# Patient Record
Sex: Female | Born: 1960 | Race: White | Hispanic: No | Smoking: Former smoker
Health system: Southern US, Community
[De-identification: ages and names within clinical notes are randomized; demographics above are authoritative.]

## PROBLEM LIST (undated history)

## (undated) DIAGNOSIS — R011 Cardiac murmur, unspecified: Secondary | ICD-10-CM

## (undated) DIAGNOSIS — O9989 Other specified diseases and conditions complicating pregnancy, childbirth and the puerperium: Secondary | ICD-10-CM

## (undated) DIAGNOSIS — E559 Vitamin D deficiency, unspecified: Secondary | ICD-10-CM

## (undated) HISTORY — PX: WISDOM TOOTH EXTRACTION: SHX21

## (undated) HISTORY — DX: Other specified diseases and conditions complicating pregnancy, childbirth and the puerperium: O99.89

## (undated) HISTORY — PX: ABDOMINAL HYSTERECTOMY: SHX81

## (undated) HISTORY — DX: Cardiac murmur, unspecified: R01.1

## (undated) HISTORY — PX: COLONOSCOPY: SHX174

## (undated) HISTORY — DX: Vitamin D deficiency, unspecified: E55.9

## (undated) HISTORY — PX: DILATION AND CURETTAGE OF UTERUS: SHX78

---

## 1982-12-09 DIAGNOSIS — O99891 Other specified diseases and conditions complicating pregnancy: Secondary | ICD-10-CM

## 1982-12-09 HISTORY — DX: Other specified diseases and conditions complicating pregnancy: O99.891

## 2000-11-06 ENCOUNTER — Emergency Department (HOSPITAL_COMMUNITY): Admission: EM | Admit: 2000-11-06 | Discharge: 2000-11-06 | Payer: Self-pay | Admitting: Emergency Medicine

## 2005-01-28 ENCOUNTER — Encounter (INDEPENDENT_AMBULATORY_CARE_PROVIDER_SITE_OTHER): Payer: Self-pay | Admitting: Specialist

## 2005-01-28 ENCOUNTER — Ambulatory Visit (HOSPITAL_COMMUNITY): Admission: RE | Admit: 2005-01-28 | Discharge: 2005-01-28 | Payer: Self-pay | Admitting: *Deleted

## 2008-09-03 ENCOUNTER — Encounter: Admission: RE | Admit: 2008-09-03 | Discharge: 2008-09-03 | Payer: Self-pay | Admitting: Obstetrics and Gynecology

## 2008-09-09 ENCOUNTER — Encounter: Admission: RE | Admit: 2008-09-09 | Discharge: 2008-09-09 | Payer: Self-pay | Admitting: Obstetrics and Gynecology

## 2010-09-25 NOTE — Op Note (Signed)
Briana Meadows, Briana Meadows           ACCOUNT NO.:  [id number]   MEDICAL RECORD NO.:  [id number]          PATIENT TYPE:  AMB   LOCATION:  SDC                           FACILITY:  [facility]   PHYSICIAN:  Boyce B. Earlene Plater, M.D.  DATE OF BIRTH:  15-Apr-1961   DATE OF PROCEDURE:  01/28/2005  DATE OF DISCHARGE:                                 OPERATIVE REPORT   PREOPERATIVE DIAGNOSES:  1.  Abnormal uterine bleeding.  2.  Endometrial polyp.   POSTOPERATIVE DIAGNOSES:  1.  Abnormal uterine bleeding.  2.  Endometrial polyp.   OPERATION/PROCEDURE:  1.  Hysteroscopy.  2.  Dilatation and curettage.  3.  Polyp removal.   SURGEON:  Tyler Run B. Earlene Plater, M.D.   ASSISTANT:  None.   ANESTHESIA:  LMA general.  10 cc of 1% nesacaine paracervical block.   SPECIMENS:  Endometrial polyp and curettings.   ESTIMATED BLOOD LOSS:  Minimal.   FLUID DEFICIT:  50 mL sorbitol.   COMPLICATIONS:  None.   INDICATIONS:  Patient with history of irregular bleeding.  Ultrasound showed  a focal endometrial mass.  Sonohystogram suggestive of endometrial polyp.  The patient was advised the risks of surgery including infection, bleeding,  perforation, and damage to surrounding organs.   DESCRIPTION OF PROCEDURE:  The patient was taken to the operating room and  LMA general anesthesia was obtained.  She was placed in stirrups and prepped  and draped in the standard fashion.  Bladder emptied with catheter.  Examination under anesthesia showed a retroverted normal size uterus.  No  adnexal pathology.   Speculum inserted and paracervical block placed.  Anterior lip of the cervix  grasped with a single-tooth tenaculum.  The cervix was dilated to without  difficulty.   The diagnostic hysteroscope was inserted after being flushed with  sorbitol.  Endometrial polyp was encountered.  Otherwise the cavity was normal.  The  polyp was removed with Randall-Stone forceps and region was gently curetted.   Instruments were removed  and cervix was hemostatic.  The patient tolerated  the procedure well without complications and was taken to the recovery room  awake, alert and in stable condition.      Gerri Spore B. Earlene Plater, M.D.  Electronically Signed     WBD/MEDQ  D:  01/28/2005  T:  01/28/2005  Job:  [id number]

## 2011-11-22 ENCOUNTER — Other Ambulatory Visit: Payer: Self-pay | Admitting: Obstetrics and Gynecology

## 2011-11-22 DIAGNOSIS — Z1231 Encounter for screening mammogram for malignant neoplasm of breast: Secondary | ICD-10-CM

## 2011-12-17 ENCOUNTER — Ambulatory Visit
Admission: RE | Admit: 2011-12-17 | Discharge: 2011-12-17 | Disposition: A | Discharge status: Home / Self Care | Payer: Commercial Indemnity | Source: Ambulatory Visit | Attending: Obstetrics and Gynecology | Admitting: Obstetrics and Gynecology

## 2011-12-17 DIAGNOSIS — Z1231 Encounter for screening mammogram for malignant neoplasm of breast: Secondary | ICD-10-CM

## 2012-09-01 ENCOUNTER — Encounter: Payer: Self-pay | Admitting: Family Medicine

## 2012-09-01 ENCOUNTER — Ambulatory Visit: Payer: Managed Care, Other (non HMO)

## 2012-09-01 ENCOUNTER — Ambulatory Visit (INDEPENDENT_AMBULATORY_CARE_PROVIDER_SITE_OTHER): Payer: Managed Care, Other (non HMO) | Admitting: Family Medicine

## 2012-09-01 VITALS — BP 129/84 | HR 73 | Temp 98.3°F | Resp 16 | Ht 61.5 in | Wt 170.0 lb

## 2012-09-01 DIAGNOSIS — R05 Cough: Secondary | ICD-10-CM

## 2012-09-01 DIAGNOSIS — Z23 Encounter for immunization: Secondary | ICD-10-CM

## 2012-09-01 DIAGNOSIS — B351 Tinea unguium: Secondary | ICD-10-CM

## 2012-09-01 DIAGNOSIS — E559 Vitamin D deficiency, unspecified: Secondary | ICD-10-CM

## 2012-09-01 DIAGNOSIS — Z87891 Personal history of nicotine dependence: Secondary | ICD-10-CM

## 2012-09-01 DIAGNOSIS — Z0000 Encounter for general adult medical examination without abnormal findings: Secondary | ICD-10-CM

## 2012-09-01 LAB — CBC WITH DIFFERENTIAL/PLATELET
Basophils Absolute: 0 10*3/uL (ref 0.0–0.1)
Basophils Relative: 1 % (ref 0–1)
Eosinophils Relative: 1 % (ref 0–5)
HCT: 43.4 % (ref 36.0–46.0)
Hemoglobin: 14.8 g/dL (ref 12.0–15.0)
Lymphocytes Relative: 32 % (ref 12–46)
MCHC: 34.1 g/dL (ref 30.0–36.0)
MCV: 87.9 fL (ref 78.0–100.0)
Monocytes Absolute: 0.4 10*3/uL (ref 0.1–1.0)
Monocytes Relative: 7 % (ref 3–12)
RDW: 13.2 % (ref 11.5–15.5)

## 2012-09-01 LAB — COMPREHENSIVE METABOLIC PANEL
AST: 18 U/L (ref 0–37)
Albumin: 4.5 g/dL (ref 3.5–5.2)
BUN: 9 mg/dL (ref 6–23)
Calcium: 9.8 mg/dL (ref 8.4–10.5)
Chloride: 104 mEq/L (ref 96–112)
Creat: 0.78 mg/dL (ref 0.50–1.10)
Glucose, Bld: 100 mg/dL — ABNORMAL HIGH (ref 70–99)
Potassium: 4.6 mEq/L (ref 3.5–5.3)

## 2012-09-01 LAB — TSH: TSH: 1.383 u[IU]/mL (ref 0.350–4.500)

## 2012-09-01 LAB — LIPID PANEL
HDL: 74 mg/dL (ref >39)
Total CHOL/HDL Ratio: 2.9 Ratio
Triglycerides: 81 mg/dL (ref <150)

## 2012-09-01 NOTE — Progress Notes (Signed)
Subjective:    Patient ID: Briana Meadows, female    DOB: 08-10-1960, 52 y.o.   MRN: [id number] Chief Complaint  Patient presents with  . Annual Exam    HPI  Doing great. No complaints.  Stopped smoking last month. Has switched to vapor e-cigarettes. Plans to use those for about 6 months then wean off of them.  Taking 5000u vit D 5d/wk.  Gets mammograms and pap smears regularly through Dr. Billy Coast at [facility]. All paps normal so does not need repeat x 5 yrs.  No prior h/o colonoscopy. Does not remember last TDaP.  Nails thick and yellow.  Review of Systems  Constitutional: Negative.   HENT: Negative.   Eyes: Negative.   Respiratory: Negative.   Cardiovascular: Negative.   Gastrointestinal: Negative.   Endocrine: Negative.   Genitourinary: Negative.   Musculoskeletal: Negative.   Skin: Negative.   Allergic/Immunologic: Negative.   Neurological: Negative.   Hematological: Negative.   Psychiatric/Behavioral: Negative.   All other systems reviewed and are negative.      BP 129/84  Pulse 73  Temp(Src) 98.3 F (36.8 C)  Resp 16  Ht 5' 1.5" (1.562 m)  Wt 170 lb (77.111 kg)  BMI 31.6 kg/m2  LMP 01/02/2012 Objective:   Physical Exam  Constitutional: She is oriented to person, place, and time. She appears well-developed and well-nourished. No distress.  HENT:  Head: Normocephalic and atraumatic.  Right Ear: Tympanic membrane, external ear and ear canal normal.  Left Ear: Tympanic membrane, external ear and ear canal normal.  Nose: Nose normal. No mucosal edema or rhinorrhea.  Mouth/Throat: Uvula is midline, oropharynx is clear and moist and mucous membranes are normal. No posterior oropharyngeal erythema.  Eyes: Conjunctivae and EOM are normal. Pupils are equal, round, and reactive to light. Right eye exhibits no discharge. Left eye exhibits no discharge. No scleral icterus.  Neck: Normal range of motion. Neck supple. No thyromegaly present.  Cardiovascular: Normal  rate, regular rhythm, normal heart sounds and intact distal pulses.   Pulmonary/Chest: Effort normal and breath sounds normal. No respiratory distress.  Exp wheeze with coughing. None with deep breathing.  Abdominal: Soft. Bowel sounds are normal. There is no tenderness.  Genitourinary: No breast swelling, tenderness, discharge or bleeding.  Musculoskeletal: She exhibits no edema.  Lymphadenopathy:    She has no cervical adenopathy.  Neurological: She is alert and oriented to person, place, and time. She has normal reflexes.  Skin: Skin is warm and dry. She is not diaphoretic. No erythema.  Bilateral toenails thick and yellowed - worst in first toes.  Psychiatric: She has a normal mood and affect. Her behavior is normal.      [facility] reading (PRIMARY) by  Dr. Clelia Croft. CXR: no abnormality seen. No nodules. Assessment & Plan:  Routine general medical examination at a health care facility - Plan: Tdap vaccine greater than or equal to 7yo IM, CBC with Differential, Comprehensive metabolic panel, Lipid panel, TSH, Vitamin D 25 hydroxy. Will turn 52 yo in 5 wks. Refer for colonoscopy.  Unspecified vitamin D deficiency - last vit D 20,  On adequate OTC replacement.  Onychomycosis - try vics vaporub - if doesn't work after sev mos and lfts nml, consider lamisil.  History of smoking - Plan: DG Chest 2 View - CONGRATS!!!  Cough - Plan: DG Chest 2 View, Comprehensive metabolic panel  Need for Tdap vaccination - Plan: Tdap vaccine greater than or equal to 7yo IM  Meds ordered this encounter  Medications  .  DISCONTD: Calcium Carbonate-Vitamin D (CALCIUM-VITAMIN D) 500-200 MG-UNIT per tablet    Sig: Take 1 tablet by mouth 2 (two) times daily with a meal.  . CALCIUM PO    Sig: Take 1,000 mg by mouth.  . cholecalciferol (VITAMIN D) 1000 UNITS tablet    Sig: Take by mouth daily.

## 2012-09-01 NOTE — Patient Instructions (Addendum)

## 2013-03-15 ENCOUNTER — Other Ambulatory Visit: Payer: Self-pay

## 2014-02-22 ENCOUNTER — Other Ambulatory Visit: Payer: Self-pay

## 2014-03-04 ENCOUNTER — Other Ambulatory Visit: Payer: Self-pay | Admitting: Obstetrics and Gynecology

## 2014-03-06 ENCOUNTER — Encounter (HOSPITAL_COMMUNITY): Payer: Self-pay | Admitting: Pharmacist

## 2014-03-07 NOTE — Patient Instructions (Addendum)
   Your procedure is scheduled on:  Thursday, Nov 5  Enter through the [employer] [facility] at:  [place] up the phone at the desk and dial [phone] and inform us of your arrival.  Please call this number if you have any problems the morning of surgery: [phone]  Remember: Do not eat food after midnight: Wednesday Do not drink clear liquids after: 6 Am Thursday, day of surgery Take these medicines the morning of surgery with a SIP OF WATER:  None  Do not wear jewelry, make-up, or FINGER nail polish No metal in your hair or on your body. Do not wear lotions, powders, perfumes.  You may wear deodorant.  Do not bring valuables to the hospital. Contacts, dentures or bridgework may not be worn into surgery.  Leave suitcase in the car. After Surgery it may be brought to your room. For patients being admitted to the hospital, checkout time is 11:00am the day of discharge.  Home with Mother [name].

## 2014-03-08 ENCOUNTER — Encounter (HOSPITAL_COMMUNITY)
Admission: RE | Admit: 2014-03-08 | Discharge: 2014-03-08 | Disposition: A | Discharge status: Home / Self Care | Payer: Managed Care, Other (non HMO) | Source: Ambulatory Visit | Attending: Obstetrics and Gynecology | Admitting: Obstetrics and Gynecology

## 2014-03-08 ENCOUNTER — Encounter (HOSPITAL_COMMUNITY): Payer: Self-pay

## 2014-03-08 ENCOUNTER — Ambulatory Visit (INDEPENDENT_AMBULATORY_CARE_PROVIDER_SITE_OTHER): Payer: Managed Care, Other (non HMO)

## 2014-03-08 DIAGNOSIS — Z23 Encounter for immunization: Secondary | ICD-10-CM

## 2014-03-08 DIAGNOSIS — Z01812 Encounter for preprocedural laboratory examination: Secondary | ICD-10-CM | POA: Diagnosis not present

## 2014-03-08 LAB — BASIC METABOLIC PANEL
ANION GAP: 11 (ref 5–15)
BUN: 11 mg/dL (ref 6–23)
CALCIUM: 9.3 mg/dL (ref 8.4–10.5)
CO2: 23 mEq/L (ref 19–32)
Chloride: 104 mEq/L (ref 96–112)
Creatinine, Ser: 0.57 mg/dL (ref 0.50–1.10)
Glucose, Bld: 103 mg/dL — ABNORMAL HIGH (ref 70–99)
Potassium: 4.5 mEq/L (ref 3.7–5.3)
SODIUM: 138 meq/L (ref 137–147)

## 2014-03-08 LAB — CBC
HCT: 41.2 % (ref 36.0–46.0)
Hemoglobin: 14.3 g/dL (ref 12.0–15.0)
MCH: 30.4 pg (ref 26.0–34.0)
MCHC: 34.7 g/dL (ref 30.0–36.0)
MCV: 87.7 fL (ref 78.0–100.0)
PLATELETS: 318 10*3/uL (ref 150–400)
RBC: 4.7 MIL/uL (ref 3.87–5.11)
RDW: 14.1 % (ref 11.5–15.5)
WBC: 6 10*3/uL (ref 4.0–10.5)

## 2014-03-14 ENCOUNTER — Ambulatory Visit (HOSPITAL_COMMUNITY): Payer: Managed Care, Other (non HMO) | Admitting: Anesthesiology

## 2014-03-14 ENCOUNTER — Encounter (HOSPITAL_COMMUNITY): Payer: Self-pay | Admitting: Emergency Medicine

## 2014-03-14 ENCOUNTER — Ambulatory Visit (HOSPITAL_COMMUNITY)
Admission: RE | Admit: 2014-03-14 | Discharge: 2014-03-15 | Disposition: A | Discharge status: Home / Self Care | Payer: Managed Care, Other (non HMO) | Source: Ambulatory Visit | Attending: Obstetrics and Gynecology | Admitting: Obstetrics and Gynecology

## 2014-03-14 ENCOUNTER — Encounter (HOSPITAL_COMMUNITY)
Admission: RE | Disposition: A | Discharge status: Home / Self Care | Payer: Self-pay | Source: Ambulatory Visit | Attending: Obstetrics and Gynecology

## 2014-03-14 DIAGNOSIS — N736 Female pelvic peritoneal adhesions (postinfective): Secondary | ICD-10-CM | POA: Insufficient documentation

## 2014-03-14 DIAGNOSIS — D259 Leiomyoma of uterus, unspecified: Secondary | ICD-10-CM | POA: Insufficient documentation

## 2014-03-14 DIAGNOSIS — N815 Vaginal enterocele: Secondary | ICD-10-CM | POA: Diagnosis not present

## 2014-03-14 DIAGNOSIS — N950 Postmenopausal bleeding: Secondary | ICD-10-CM | POA: Insufficient documentation

## 2014-03-14 DIAGNOSIS — D219 Benign neoplasm of connective and other soft tissue, unspecified: Secondary | ICD-10-CM | POA: Diagnosis present

## 2014-03-14 DIAGNOSIS — Z87891 Personal history of nicotine dependence: Secondary | ICD-10-CM | POA: Insufficient documentation

## 2014-03-14 DIAGNOSIS — N840 Polyp of corpus uteri: Secondary | ICD-10-CM | POA: Insufficient documentation

## 2014-03-14 DIAGNOSIS — N8320 Unspecified ovarian cysts: Secondary | ICD-10-CM | POA: Insufficient documentation

## 2014-03-14 HISTORY — PX: ROBOTIC ASSISTED TOTAL HYSTERECTOMY: SHX6085

## 2014-03-14 SURGERY — ROBOTIC ASSISTED TOTAL HYSTERECTOMY
Anesthesia: General | Site: Abdomen

## 2014-03-14 MED ORDER — DEXAMETHASONE SODIUM PHOSPHATE 4 MG/ML IJ SOLN
INTRAMUSCULAR | Status: AC
  Filled 2014-03-14: qty 1

## 2014-03-14 MED ORDER — ARTIFICIAL TEARS OP OINT
TOPICAL_OINTMENT | OPHTHALMIC | Status: AC
  Filled 2014-03-14: qty 3.5

## 2014-03-14 MED ORDER — SODIUM CHLORIDE 0.9 % IJ SOLN
INTRAMUSCULAR | Status: AC
  Filled 2014-03-14: qty 10

## 2014-03-14 MED ORDER — ROPIVACAINE HCL 5 MG/ML IJ SOLN
INTRAMUSCULAR | Status: AC
  Filled 2014-03-14: qty 60

## 2014-03-14 MED ORDER — DEXTROSE IN LACTATED RINGERS 5 % IV SOLN
INTRAVENOUS | Status: DC
  Administered 2014-03-14: via INTRAVENOUS

## 2014-03-14 MED ORDER — DIPHENHYDRAMINE HCL 12.5 MG/5ML PO ELIX: 12.5000 mg | ORAL_SOLUTION | Freq: Four times a day (QID) | ORAL | Status: DC | PRN

## 2014-03-14 MED ORDER — DIPHENHYDRAMINE HCL 50 MG/ML IJ SOLN: 12.5000 mg | Freq: Four times a day (QID) | INTRAMUSCULAR | Status: DC | PRN

## 2014-03-14 MED ORDER — GLYCOPYRROLATE 0.2 MG/ML IJ SOLN
INTRAMUSCULAR | Status: DC | PRN
  Administered 2014-03-14: .4 mg via INTRAVENOUS

## 2014-03-14 MED ORDER — NEOSTIGMINE METHYLSULFATE 10 MG/10ML IV SOLN
INTRAVENOUS | Status: DC | PRN
  Administered 2014-03-14: 3 mg via INTRAVENOUS

## 2014-03-14 MED ORDER — CEFAZOLIN SODIUM-DEXTROSE 2-3 GM-% IV SOLR
INTRAVENOUS | Status: AC
  Filled 2014-03-14: qty 50

## 2014-03-14 MED ORDER — PROPOFOL 10 MG/ML IV BOLUS
INTRAVENOUS | Status: DC | PRN
  Administered 2014-03-14: 200 mg via INTRAVENOUS

## 2014-03-14 MED ORDER — PROPOFOL 10 MG/ML IV EMUL
INTRAVENOUS | Status: AC
  Filled 2014-03-14: qty 20

## 2014-03-14 MED ORDER — SODIUM CHLORIDE 0.9 % IV SOLN
INTRAVENOUS | Status: DC | PRN
  Administered 2014-03-14: 86 mL

## 2014-03-14 MED ORDER — SODIUM CHLORIDE 0.9 % IJ SOLN: 9.0000 mL | INTRAMUSCULAR | Status: DC | PRN

## 2014-03-14 MED ORDER — ACETAMINOPHEN 10 MG/ML IV SOLN
1000.0000 mg | Freq: Once | INTRAVENOUS | Status: AC
  Administered 2014-03-14: 1000 mg via INTRAVENOUS
  Filled 2014-03-14: qty 100

## 2014-03-14 MED ORDER — PROMETHAZINE HCL 25 MG/ML IJ SOLN
6.2500 mg | INTRAMUSCULAR | Status: DC | PRN
Start: 2014-03-14 — End: 2014-03-14

## 2014-03-14 MED ORDER — KETOROLAC TROMETHAMINE 30 MG/ML IJ SOLN
INTRAMUSCULAR | Status: AC
  Filled 2014-03-14: qty 1

## 2014-03-14 MED ORDER — TRAMADOL HCL 50 MG PO TABS
50.0000 mg | ORAL_TABLET | Freq: Four times a day (QID) | ORAL | Status: DC | PRN
  Administered 2014-03-15: 50 mg via ORAL
  Filled 2014-03-14 (×2): qty 1

## 2014-03-14 MED ORDER — GLYCOPYRROLATE 0.2 MG/ML IJ SOLN
INTRAMUSCULAR | Status: AC
Start: 2014-03-14 — End: 2014-03-14
  Filled 2014-03-14: qty 2

## 2014-03-14 MED ORDER — MIDAZOLAM HCL 2 MG/2ML IJ SOLN
INTRAMUSCULAR | Status: AC
  Filled 2014-03-14: qty 2

## 2014-03-14 MED ORDER — FENTANYL CITRATE 0.05 MG/ML IJ SOLN
25.0000 ug | INTRAMUSCULAR | Status: DC | PRN
  Administered 2014-03-14: 50 ug via INTRAVENOUS

## 2014-03-14 MED ORDER — FENTANYL CITRATE 0.05 MG/ML IJ SOLN
INTRAMUSCULAR | Status: AC
  Filled 2014-03-14: qty 5

## 2014-03-14 MED ORDER — ONDANSETRON HCL 4 MG/2ML IJ SOLN
INTRAMUSCULAR | Status: DC | PRN
  Administered 2014-03-14: 4 mg via INTRAVENOUS

## 2014-03-14 MED ORDER — LIDOCAINE HCL (CARDIAC) 20 MG/ML IV SOLN
INTRAVENOUS | Status: AC
  Filled 2014-03-14: qty 5

## 2014-03-14 MED ORDER — FENTANYL CITRATE 0.05 MG/ML IJ SOLN
INTRAMUSCULAR | Status: AC
  Administered 2014-03-14: 50 ug via INTRAVENOUS
  Filled 2014-03-14: qty 2

## 2014-03-14 MED ORDER — ONDANSETRON HCL 4 MG/2ML IJ SOLN
INTRAMUSCULAR | Status: AC
  Filled 2014-03-14: qty 2

## 2014-03-14 MED ORDER — MIDAZOLAM HCL 2 MG/2ML IJ SOLN
INTRAMUSCULAR | Status: DC | PRN
  Administered 2014-03-14: 2 mg via INTRAVENOUS

## 2014-03-14 MED ORDER — DEXAMETHASONE SODIUM PHOSPHATE 4 MG/ML IJ SOLN
INTRAMUSCULAR | Status: DC | PRN
  Administered 2014-03-14: 4 mg via INTRAVENOUS

## 2014-03-14 MED ORDER — SODIUM CHLORIDE 0.9 % IJ SOLN
INTRAMUSCULAR | Status: AC
Start: 2014-03-14 — End: 2014-03-14
  Filled 2014-03-14: qty 50

## 2014-03-14 MED ORDER — KETOROLAC TROMETHAMINE 30 MG/ML IJ SOLN
INTRAMUSCULAR | Status: DC | PRN
  Administered 2014-03-14: 30 mg via INTRAVENOUS

## 2014-03-14 MED ORDER — HYDROMORPHONE HCL 1 MG/ML IJ SOLN
INTRAMUSCULAR | Status: DC | PRN
  Administered 2014-03-14: 0.5 mg via INTRAVENOUS
  Administered 2014-03-14: .5 mg via INTRAVENOUS

## 2014-03-14 MED ORDER — NALOXONE HCL 0.4 MG/ML IJ SOLN: 0.4000 mg | INTRAMUSCULAR | Status: DC | PRN

## 2014-03-14 MED ORDER — CEFAZOLIN SODIUM-DEXTROSE 2-3 GM-% IV SOLR
2.0000 g | INTRAVENOUS | Status: AC
  Administered 2014-03-14: 2 g via INTRAVENOUS

## 2014-03-14 MED ORDER — LIDOCAINE HCL (CARDIAC) 20 MG/ML IV SOLN
INTRAVENOUS | Status: DC | PRN
  Administered 2014-03-14: 60 mg via INTRAVENOUS

## 2014-03-14 MED ORDER — MEPERIDINE HCL 25 MG/ML IJ SOLN: 6.2500 mg | INTRAMUSCULAR | Status: DC | PRN

## 2014-03-14 MED ORDER — LACTATED RINGERS IV SOLN
INTRAVENOUS | Status: DC
  Administered 2014-03-14 (×2): via INTRAVENOUS

## 2014-03-14 MED ORDER — OXYCODONE-ACETAMINOPHEN 5-325 MG PO TABS
1.0000 | ORAL_TABLET | ORAL | Status: DC | PRN
  Administered 2014-03-15: 1 via ORAL
  Filled 2014-03-14: qty 1

## 2014-03-14 MED ORDER — SCOPOLAMINE 1 MG/3DAYS TD PT72
MEDICATED_PATCH | TRANSDERMAL | Status: AC
  Administered 2014-03-14: 1.5 mg via TRANSDERMAL
  Filled 2014-03-14: qty 1

## 2014-03-14 MED ORDER — HYDROMORPHONE HCL 1 MG/ML IJ SOLN
INTRAMUSCULAR | Status: AC
  Filled 2014-03-14: qty 1

## 2014-03-14 MED ORDER — HYDROMORPHONE 0.3 MG/ML IV SOLN
INTRAVENOUS | Status: DC
  Administered 2014-03-14: 0.4 mg via INTRAVENOUS
  Administered 2014-03-14: 17:00:00 via INTRAVENOUS
  Administered 2014-03-15: 0.199 mg via INTRAVENOUS
  Filled 2014-03-14: qty 25

## 2014-03-14 MED ORDER — ROCURONIUM BROMIDE 100 MG/10ML IV SOLN
INTRAVENOUS | Status: AC
  Filled 2014-03-14: qty 1

## 2014-03-14 MED ORDER — ROCURONIUM BROMIDE 100 MG/10ML IV SOLN
INTRAVENOUS | Status: DC | PRN
  Administered 2014-03-14: 5 mg via INTRAVENOUS
  Administered 2014-03-14: 50 mg via INTRAVENOUS

## 2014-03-14 MED ORDER — ONDANSETRON HCL 4 MG/2ML IJ SOLN
4.0000 mg | Freq: Four times a day (QID) | INTRAMUSCULAR | Status: DC | PRN
Start: 2014-03-14 — End: 2014-03-15

## 2014-03-14 MED ORDER — KETOROLAC TROMETHAMINE 30 MG/ML IJ SOLN: 15.0000 mg | Freq: Once | INTRAMUSCULAR | Status: DC | PRN

## 2014-03-14 MED ORDER — NEOSTIGMINE METHYLSULFATE 10 MG/10ML IV SOLN
INTRAVENOUS | Status: AC
Start: 2014-03-14 — End: 2014-03-14
  Filled 2014-03-14: qty 1

## 2014-03-14 MED ORDER — FENTANYL CITRATE 0.05 MG/ML IJ SOLN
INTRAMUSCULAR | Status: DC | PRN
  Administered 2014-03-14 (×5): 50 ug via INTRAVENOUS

## 2014-03-14 MED ORDER — SCOPOLAMINE 1 MG/3DAYS TD PT72
1.0000 | MEDICATED_PATCH | Freq: Once | TRANSDERMAL | Status: DC
  Administered 2014-03-14: 1.5 mg via TRANSDERMAL

## 2014-03-14 SURGICAL SUPPLY — 58 items
BARRIER ADHS 3X4 INTERCEED (GAUZE/BANDAGES/DRESSINGS) ×3 IMPLANT
BRR ADH 4X3 ABS CNTRL BYND (GAUZE/BANDAGES/DRESSINGS) ×1
CATH FOLEY 3WAY  5CC 16FR (CATHETERS) ×2
CATH FOLEY 3WAY 5CC 16FR (CATHETERS) ×1 IMPLANT
CLOTH BEACON ORANGE TIMEOUT ST (SAFETY) ×3 IMPLANT
CONT PATH 16OZ SNAP LID 3702 (MISCELLANEOUS) ×3 IMPLANT
COVER BACK TABLE 60X90IN (DRAPES) ×6 IMPLANT
COVER TIP SHEARS 8 DVNC (MISCELLANEOUS) ×1 IMPLANT
COVER TIP SHEARS 8MM DA VINCI (MISCELLANEOUS) ×2
DECANTER SPIKE VIAL GLASS SM (MISCELLANEOUS) ×12 IMPLANT
DRAPE HUG U DISPOSABLE (DRAPE) ×3 IMPLANT
DRAPE WARM FLUID 44X44 (DRAPE) ×3 IMPLANT
DRSG COVADERM PLUS 2X2 (GAUZE/BANDAGES/DRESSINGS) ×2 IMPLANT
DRSG OPSITE POSTOP 3X4 (GAUZE/BANDAGES/DRESSINGS) ×2 IMPLANT
DURAPREP 26ML APPLICATOR (WOUND CARE) ×3 IMPLANT
ELECT REM PT RETURN 9FT ADLT (ELECTROSURGICAL) ×3
ELECTRODE REM PT RTRN 9FT ADLT (ELECTROSURGICAL) ×1 IMPLANT
EVACUATOR SMOKE 8.L (FILTER) ×3 IMPLANT
GAUZE VASELINE 3X9 (GAUZE/BANDAGES/DRESSINGS) IMPLANT
GLOVE BIO SURGEON STRL SZ7.5 (GLOVE) ×6 IMPLANT
GLOVE BIOGEL PI IND STRL 7.0 (GLOVE) ×2 IMPLANT
GLOVE BIOGEL PI INDICATOR 7.0 (GLOVE) ×4
GOWN STRL REUS W/TWL LRG LVL3 (GOWN DISPOSABLE) ×30 IMPLANT
KIT ACCESSORY DA VINCI DISP (KITS) ×2
KIT ACCESSORY DVNC DISP (KITS) ×1 IMPLANT
LEGGING LITHOTOMY PAIR STRL (DRAPES) ×3 IMPLANT
LIQUID BAND (GAUZE/BANDAGES/DRESSINGS) ×3 IMPLANT
NEEDLE INSUFFLATION 150MM (ENDOMECHANICALS) ×3 IMPLANT
OCCLUDER COLPOPNEUMO (BALLOONS) IMPLANT
PACK ROBOT WH (CUSTOM PROCEDURE TRAY) ×3 IMPLANT
PAD PREP 24X48 CUFFED NSTRL (MISCELLANEOUS) ×6 IMPLANT
PAD TRENDELENBURG POSITION (MISCELLANEOUS) ×3 IMPLANT
PROTECTOR NERVE ULNAR (MISCELLANEOUS) ×6 IMPLANT
SCISSORS LAP 5X35 DISP (ENDOMECHANICALS) ×2 IMPLANT
SET CYSTO W/LG BORE CLAMP LF (SET/KITS/TRAYS/PACK) ×2 IMPLANT
SET IRRIG TUBING LAPAROSCOPIC (IRRIGATION / IRRIGATOR) ×3 IMPLANT
SUT VIC AB 0 CT1 27 (SUTURE) ×6
SUT VIC AB 0 CT1 27XBRD ANBCTR (SUTURE) ×2 IMPLANT
SUT VICRYL 0 UR6 27IN ABS (SUTURE) ×3 IMPLANT
SUT VICRYL RAPIDE 4/0 PS 2 (SUTURE) ×6 IMPLANT
SUT VLOC 180 0 9IN  GS21 (SUTURE) ×2
SUT VLOC 180 0 9IN GS21 (SUTURE) IMPLANT
SUT VLOC 180 2-0 9IN GS21 (SUTURE) ×4 IMPLANT
SYR 50ML LL SCALE MARK (SYRINGE) ×3 IMPLANT
SYRINGE 10CC LL (SYRINGE) ×3 IMPLANT
TIP RUMI ORANGE 6.7MMX12CM (TIP) IMPLANT
TIP UTERINE 5.1X6CM LAV DISP (MISCELLANEOUS) IMPLANT
TIP UTERINE 6.7X10CM GRN DISP (MISCELLANEOUS) IMPLANT
TIP UTERINE 6.7X6CM WHT DISP (MISCELLANEOUS) IMPLANT
TIP UTERINE 6.7X8CM BLUE DISP (MISCELLANEOUS) IMPLANT
TOWEL OR 17X24 6PK STRL BLUE (TOWEL DISPOSABLE) ×9 IMPLANT
TROCAR DISP BLADELESS 8 DVNC (TROCAR) ×1 IMPLANT
TROCAR DISP BLADELESS 8MM (TROCAR) ×2
TROCAR XCEL 12X100 BLDLESS (ENDOMECHANICALS) IMPLANT
TROCAR XCEL NON-BLD 5MMX100MML (ENDOMECHANICALS) ×3 IMPLANT
TROCAR Z-THREAD 12X150 (TROCAR) ×3 IMPLANT
WARMER LAPAROSCOPE (MISCELLANEOUS) ×3 IMPLANT
WATER STERILE IRR 1000ML POUR (IV SOLUTION) ×9 IMPLANT

## 2014-03-14 NOTE — Progress Notes (Signed)
Patient ID: Briana Meadows, female   DOB: February 07, 1961, 53 y.o.   MRN: [id number] Patient seen and examined. Consent witnessed and signed. No changes noted. Update completed.

## 2014-03-14 NOTE — H&P (Signed)
NAMESECILIA, APPS           ACCOUNT NO.:  [id number]  MEDICAL RECORD NO.:  [id number]  LOCATION:  PERIO                         FACILITY:  [facility]  PHYSICIAN:  Lovenia Kim, M.D.DATE OF BIRTH:  1961-01-17  DATE OF ADMISSION:  01/28/2014 DATE OF DISCHARGE:                             HISTORY & PHYSICAL   CHIEF COMPLAINT:  Postmenopausal bleeding and endometrial mass.  HISTORY OF PRESENT ILLNESS:  A 53 year old white female, G4, P2, postmenopausal bleeding. Normal Endometrial biopsy with symptomatic uterine fibroid for definitive surgery.  SURGICAL HISTORY:  Abortion x2.  Delivery x2.  Denies domestic violence, history of hypertension, diabetes.  She is a reformed smoker.  PHYSICAL EXAMINATION:  GENERAL:  Well-developed, well-nourished white female, in no acute distress. HEENT:  Normal. NECK:  Supple.  Full range of motion. LUNGS:  Clear. HEART:  Regular rate and rhythm. ABDOMEN:  Soft, nontender. PELVIC:  Appears to be retroflexed.  Slightly bulky.  No adnexal masses appreciated.  IMPRESSION:  Postmenopausal bleeding with symptomatic fibroids, history of nl ebx at this time proceed with total LS hysterectomy and bilateral salpingectomy .  Risks of anesthesia, infection, bleeding, with delayed versus immediate complications to include bowel and bladder injury noted.  The patient acknowledges and wishes to proceed.     Lovenia Kim, M.D.     RJT/MEDQ  D:  03/13/2014  T:  03/13/2014  Job:  [id number]

## 2014-03-14 NOTE — Op Note (Signed)
03/14/2014  3:29 PM  PATIENT:  Briana Meadows  53 y.o. female  PRE-OPERATIVE DIAGNOSIS:  Postmenopausal Bleeding, Endometrial Mass  POST-OPERATIVE DIAGNOSIS:  postmenopausal bleeding,endometrial mass Uterine fibroids Enterocele, Apical vaginal support defect Right ovarian cyst Right adnexal adhesions  PROCEDURE:  Procedure(s): ROBOTIC ASSISTED TOTAL HYSTERECTOMY WITH BILATERAL SALPINGECTOMY,  RIGHT OVARIAN CYSTECTOMY RIGHT OVARIAN CYSTOTOMY LYSIS OF ADHESIONS UTEROSACRAL LIGAMENT SUSPENSION  SURGEON:  Surgeon(s): Lovenia Kim, MD  ASSISTANTS: Renato Battles, CNM   ANESTHESIA:   local and general  ESTIMATED BLOOD LOSS: minimal  DRAINS: Urinary Catheter (Foley)   LOCAL MEDICATIONS USED:  MARCAINE    and Amount: 20 ml  SPECIMEN:  Source of Specimen:  uterus , cervix , bilateral tubes, right ovarian cyst  DISPOSITION OF SPECIMEN:  PATHOLOGY  COUNTS:  YES  DICTATION #: [id number]  PLAN OF CARE: dc home  PATIENT DISPOSITION:  PACU - hemodynamically stable.

## 2014-03-14 NOTE — Anesthesia Postprocedure Evaluation (Signed)
  Anesthesia Post-op Note  Patient: Briana Meadows  Procedure(s) Performed: Procedure(s): ROBOTIC ASSISTED TOTAL HYSTERECTOMY WITH BILATERAL SALPINGECTOMY, removal of right ovarian cyst, uterosacral ligment suspension (N/A) Patient is awake and responsive. Pain and nausea are reasonably well controlled. Vital signs are stable and clinically acceptable. Oxygen saturation is clinically acceptable. There are no apparent anesthetic complications at this time. Patient is ready for discharge.

## 2014-03-14 NOTE — Plan of Care (Signed)
Problem: Phase I Progression Outcomes Goal: IS, TCDB as ordered Outcome: Completed/Met Date Met:  03/14/14

## 2014-03-14 NOTE — Transfer of Care (Signed)
Immediate Anesthesia Transfer of Care Note  Patient: Zilda No  Procedure(s) Performed: Procedure(s): ROBOTIC ASSISTED TOTAL HYSTERECTOMY WITH BILATERAL SALPINGECTOMY, removal of right ovarian cyst, uterosacral ligment suspension (N/A)  Patient Location: PACU  Anesthesia Type:General  Level of Consciousness: awake, alert  and oriented  Airway & Oxygen Therapy: Patient Spontanous Breathing and Patient connected to nasal cannula oxygen  Post-op Assessment: Report given to PACU RN and Post -op Vital signs reviewed and stable  Post vital signs: Reviewed and stable  Complications: No apparent anesthesia complications

## 2014-03-14 NOTE — Anesthesia Preprocedure Evaluation (Addendum)
Anesthesia Evaluation  Patient identified by MRN, date of birth, ID band Patient awake    Reviewed: Allergy & Precautions, H&P , NPO status , Patient's Chart, lab work & pertinent test results, reviewed documented beta blocker date and time   History of Anesthesia Complications Negative for: history of anesthetic complications  Airway Mallampati: II  TM Distance: >3 FB Neck ROM: full    Dental  (+) Poor Dentition   Pulmonary former smoker,  breath sounds clear to auscultation  Pulmonary exam normal       Cardiovascular + Valvular Problems/Murmurs (childhood - no problems) Rhythm:regular Rate:Normal     Neuro/Psych negative neurological ROS  negative psych ROS   GI/Hepatic negative GI ROS, Neg liver ROS,   Endo/Other  negative endocrine ROS  Renal/GU negative Renal ROS     Musculoskeletal   Abdominal   Peds  Hematology negative hematology ROS (+) H/o blood transfusion with pregnancy in 1984   Anesthesia Other Findings   Reproductive/Obstetrics negative OB ROS                            Anesthesia Physical Anesthesia Plan  ASA: II  Anesthesia Plan: General ETT   Post-op Pain Management:    Induction:   Airway Management Planned:   Additional Equipment:   Intra-op Plan:   Post-operative Plan:   Informed Consent: I have reviewed the patients History and Physical, chart, labs and discussed the procedure including the risks, benefits and alternatives for the proposed anesthesia with the patient or authorized representative who has indicated his/her understanding and acceptance.   Dental Advisory Given  Plan Discussed with: CRNA and Surgeon  Anesthesia Plan Comments:         Anesthesia Quick Evaluation

## 2014-03-14 NOTE — Op Note (Signed)
Briana Meadows, Briana Meadows           ACCOUNT NO.:  [id number]  MEDICAL RECORD NO.:  [id number]  LOCATION:  5537                          FACILITY:  [facility]  PHYSICIAN:  Lovenia Kim, M.D.DATE OF BIRTH:  10-06-1960  DATE OF PROCEDURE:  03/14/2014 DATE OF DISCHARGE:                              OPERATIVE REPORT   PREOPERATIVE DIAGNOSES:  Postmenopausal bleeding, endometrial mass, normal endometrial biopsy, uterine fibroids.  POSTOPERATIVE DIAGNOSES:  Postmenopausal bleeding, endometrial mass, normal endometrial biopsy, uterine fibroids, right ovarian cyst, apical vaginal support defect, enterocele, pelvic adhesions.  PROCEDURE:  Robotically-assisted total laparoscopic hysterectomy, bilateral salpingectomy, right ovarian cystectomy, right ovarian cystotomy, lysis of right adnexal adhesions and uterosacral ligament suspension.  SURGEON:  Lovenia Kim, M.D.  ASSISTANT:  Laqueta Carina.  ESTIMATED BLOOD LOSS:  Less than 50 mL.  COMPLICATIONS:  None.  DRAINS:  Foley.  COUNTS:  Correct.  DISPOSITION:  The patient to recovery in good condition.  BRIEF OPERATIVE NOTE:  After being apprised of risks of anesthesia, infection, bleeding, injury to surrounding organs, possible need for repair, delayed versus immediate complications to include bowel and bladder injury, possible need for repair, the patient was brought to the operating room where she was administered her general anesthetic without complications.  Prepped and draped in usual sterile fashion.  A Foley catheter placed.  Exam under anesthesia reveals a bulky retroflexed uterus and no adnexal masses.  Foley catheter placed without difficulty. RUMI retractor placed without difficulty.  At this time, infraumbilical incision was made with a scalpel.  Veress needle placed, opening pressure -2.  A 3 L CO2 insufflated without difficulty.  Trocar was placed atraumatically.  Pictures taken.  Normal appendix, normal  liver, gallbladder area.  There are adhesions of the right adnexa, in addition to a right ovarian cyst and the adhesions are to the right ovarian fossa.  At this time, 2 robotic ports were placed 1 on the left, 1 on the right and 5 mm port on the left.  The EnSeal device was established. After achieving deep Trendelenburg position, the robot was docked in the standard fashion.  Atraumatic trocar entry having been assured.  At this time, the EndoShears and PK forceps are attached as a robotic instruments and attention was turned to the left adnexa whereby the ureters identified.  The mesosalpinx was cauterized open and the left tube was detached.  The retroperitoneal space was entered lateral to the left ovary and the ureters identified coursing along the medial leaf of the peritoneum.  Retroperitoneal dissection was done exposing the window between the ovary and the uterus.  The right ovarian ligament was clamped and cauterized with bipolar cautery and a 2-point method and divided.  The left round ligament was cauterized and divided.  The bladder flap was developed sharply with retroversion of the uterus.  At this time, attention was turned to the right adnexa whereby right adnexal adhesions were lysed freeing the right ovary and exophytic cyst appears off the right ovary with a cystic and solid component.  This was excised at the base and sent to Pathology for permanent section.  A right ovarian cystotomy for simple clear cystic fluid was performed as well.  The right mesosalpinx was  cauterized and divided.  The retroperitoneal space was entered on the right.  The ureter was noted along the medial leaf of the peritoneum.  Window was made exposing the skeletonization of the right ovarian ligament which was cauterized using bipolar cautery and cut.  The round ligament was divided using monopolar cautery and the bladder flap dissection was finished, dissecting the bladder sharply off the  RUMI cup.  The uterine vessels were skeletonized bilaterally, cauterized, and cut.  The specimen was then freed by using monopolar cut current to divide up the cervicovaginal junction.  A RUMI cup was exposed.  The specimen was retracted and removed vaginally.  At this time, vaginal cuff was then closed using 0 Vicryl in a continuous running fashion.  Due to exam under anesthesia noting a probable apical support defect, the uterosacral ligament suspension was performed using a 2-0 V-Loc suture on the right and left side with good suspension of the lateral portions to the vaginal apex shortening the uterosacral ligaments during the process, so this was a bilateral uterosacral ligament suspension.  Subsequent to the suspension, the ureters were seen easily, peristalsing normally of normal caliber bilaterally and normal urine output was noted.  At this time, all instruments were removed.  CO2 was released.  There was a small area of oozing along the anterior vaginal cuff.  Surgicel was placed.  Good hemostasis was noted. All trocars were then removed under direct visualization after the robot was undocked and CO2 was released.  Incisions were closed using 0 Vicryl, 2-0 Vicryl, and Dermabond.  The patient tolerated the procedure well.  Urine was clear and copious.  Vaginal exam reveals a well approximated and well supported vaginal cuff.  The patient was transferred to recovery in good condition.     Lovenia Kim, M.D.     RJT/MEDQ  D:  03/14/2014  T:  03/14/2014  Job:  [id number]

## 2014-03-15 DIAGNOSIS — N950 Postmenopausal bleeding: Secondary | ICD-10-CM | POA: Diagnosis not present

## 2014-03-15 LAB — COMPREHENSIVE METABOLIC PANEL
ALT: 18 U/L (ref 0–35)
ANION GAP: 12 (ref 5–15)
AST: 17 U/L (ref 0–37)
Albumin: 3.1 g/dL — ABNORMAL LOW (ref 3.5–5.2)
Alkaline Phosphatase: 50 U/L (ref 39–117)
BILIRUBIN TOTAL: 0.6 mg/dL (ref 0.3–1.2)
BUN: 7 mg/dL (ref 6–23)
CHLORIDE: 108 meq/L (ref 96–112)
CO2: 21 meq/L (ref 19–32)
CREATININE: 0.66 mg/dL (ref 0.50–1.10)
Calcium: 8.2 mg/dL — ABNORMAL LOW (ref 8.4–10.5)
GFR calc Af Amer: >90 mL/min (ref >90)
Glucose, Bld: 135 mg/dL — ABNORMAL HIGH (ref 70–99)
Potassium: 4.4 mEq/L (ref 3.7–5.3)
Sodium: 141 mEq/L (ref 137–147)
Total Protein: 5.6 g/dL — ABNORMAL LOW (ref 6.0–8.3)

## 2014-03-15 LAB — CBC
HEMATOCRIT: 36.8 % (ref 36.0–46.0)
Hemoglobin: 12.6 g/dL (ref 12.0–15.0)
MCH: 30.1 pg (ref 26.0–34.0)
MCHC: 34.2 g/dL (ref 30.0–36.0)
MCV: 87.8 fL (ref 78.0–100.0)
Platelets: 272 10*3/uL (ref 150–400)
RBC: 4.19 MIL/uL (ref 3.87–5.11)
RDW: 14.2 % (ref 11.5–15.5)
WBC: 10 10*3/uL (ref 4.0–10.5)

## 2014-03-15 MED ORDER — OXYCODONE-ACETAMINOPHEN 5-325 MG PO TABS: 1.0000 | ORAL_TABLET | ORAL | Status: DC | PRN

## 2014-03-15 MED ORDER — TRAMADOL HCL 50 MG PO TABS: 50.0000 mg | ORAL_TABLET | Freq: Four times a day (QID) | ORAL | Status: DC | PRN

## 2014-03-15 NOTE — Progress Notes (Signed)
1 Day Post-Op Procedure(s) (LRB): ROBOTIC ASSISTED TOTAL HYSTERECTOMY WITH BILATERAL SALPINGECTOMY, removal of right ovarian cyst, uterosacral ligment suspension (N/A)  Subjective: Patient reports nausea, incisional pain, tolerating PO, + flatus and no problems voiding.    Objective: I have reviewed patient's vital signs, medications and labs. BP 94/40 mmHg  Pulse 70  Temp(Src) 99.6 F (37.6 C) (Oral)  Resp 16  Ht 5' (1.524 m)  Wt 76.658 kg (169 lb)  BMI 33.01 kg/m2  SpO2 97%  LMP 03/14/2014  CBC    Component Value Date/Time   WBC 10.0 03/15/2014 0535   RBC 4.19 03/15/2014 0535   HGB 12.6 03/15/2014 0535   HCT 36.8 03/15/2014 0535   PLT 272 03/15/2014 0535   MCV 87.8 03/15/2014 0535   MCH 30.1 03/15/2014 0535   MCHC 34.2 03/15/2014 0535   RDW 14.2 03/15/2014 0535   LYMPHSABS 2.0 09/01/2012 0936   MONOABS 0.4 09/01/2012 0936   EOSABS 0.1 09/01/2012 0936   BASOSABS 0.0 09/01/2012 0936       General: alert, cooperative and appears stated age Resp: clear to auscultation bilaterally and normal percussion bilaterally Cardio: regular rate and rhythm, S1, S2 normal, no murmur, click, rub or gallop and normal apical impulse GI: soft, non-tender; bowel sounds normal; no masses,  no organomegaly and incision: clean, dry and intact Extremities: extremities normal, atraumatic, no cyanosis or edema and Homans sign is negative, no sign of DVT Vaginal Bleeding: minimal  Assessment: s/p Procedure(s): ROBOTIC ASSISTED TOTAL HYSTERECTOMY WITH BILATERAL SALPINGECTOMY, removal of right ovarian cyst, uterosacral ligment suspension (N/A): stable, progressing well and tolerating diet  Plan: Advance diet Encourage ambulation Advance to PO medication Discontinue IV fluids Discharge home  LOS: 1 day    Azarius Lambson J 03/15/2014, 1:16 AM

## 2014-03-15 NOTE — Anesthesia Postprocedure Evaluation (Signed)
  Anesthesia Post-op Note  Patient: Briana Meadows  Procedure(s) Performed: Procedure(s): ROBOTIC ASSISTED TOTAL HYSTERECTOMY WITH BILATERAL SALPINGECTOMY, removal of right ovarian cyst, uterosacral ligment suspension (N/A)  Patient Location: Mother/Baby  Anesthesia Type:General  Level of Consciousness: awake and alert   Airway and Oxygen Therapy: Patient Spontanous Breathing  Post-op Pain: mild  Post-op Assessment: Post-op Vital signs reviewed, Patient's Cardiovascular Status Stable, Respiratory Function Stable, No signs of Nausea or vomiting, Pain level controlled, No headache, No residual numbness and No residual motor weakness  Post-op Vital Signs: Reviewed  Last Vitals:  Filed Vitals:   03/15/14 0514  BP: 94/40  Pulse: 70  Temp: 37.6 C  Resp: 16    Complications: No apparent anesthesia complications

## 2014-03-15 NOTE — Addendum Note (Signed)
Addendum  created 03/15/14 0914 by Garner Nash, CRNA   Modules edited: Notes Section   Notes Section:  File: [id number]

## 2014-03-15 NOTE — Plan of Care (Signed)
Problem: Consults Goal: GYN POST OP OBSV 2 days Patient Education (See Patient Education module for education specifics.)  Outcome: Completed/Met Date Met:  03/15/14  Problem: Phase I Progression Outcomes Goal: Pain controlled with appropriate interventions Outcome: Completed/Met Date Met:  03/15/14 Goal: Admission history reviewed Outcome: Completed/Met Date Met:  03/15/14 Goal: Dangle/OOB as tolerated per MD order Outcome: Completed/Met Date Met:  03/15/14 Goal: VS, stable, temp < 100.4 degrees F Outcome: Completed/Met Date Met:  03/15/14 Goal: I & O every 4 hrs or as ordered Outcome: Completed/Met Date Met:  03/15/14 Goal: Other Phase I Outcomes/Goals Outcome: Completed/Met Date Met:  03/15/14  Problem: Phase II Progression Outcomes Goal: Progress activity as tolerated unless otherwise ordered Outcome: Completed/Met Date Met:  03/15/14 Goal: Afebrile, VS remain stable Outcome: Completed/Met Date Met:  03/15/14 Goal: Voiding trials/Bladder training within 48 hrs Outcome: Not Applicable Date Met:  31/49/70 Goal: Incision/dressings dry and intact Outcome: Completed/Met Date Met:  03/15/14  Problem: Discharge Progression Outcomes Goal: Tolerating diet Outcome: Completed/Met Date Met:  03/15/14 Goal: Discontinue staples (if applicable) Outcome: Not Applicable Date Met:  26/37/85

## 2014-03-16 ENCOUNTER — Encounter (HOSPITAL_COMMUNITY): Payer: Self-pay | Admitting: Obstetrics and Gynecology

## 2016-02-11 DIAGNOSIS — E559 Vitamin D deficiency, unspecified: Secondary | ICD-10-CM | POA: Insufficient documentation

## 2016-02-11 NOTE — Progress Notes (Signed)
Subjective:    Patient ID: Briana Meadows, female    DOB: [birth year], 55 y.o.   MRN: [id number] Chief Complaint  Patient presents with  . Annual Exam    no pap, pt has a form from work, hemoglobin a1c needs to be included in bloodwork    HPI  Briana Meadows is a 55 yo woman who I last saw 3.5 years ago for her CPE.  Does not eat breakfast ever.  Preventative medicine: Cervical Cancer: Pap done at gynecology Dr. Ronita Hipps. No h/o abnml. Underwent total hysterectomy with tubes out 03/2014 - 2 years prior - for postmenopausal bleeding but no cancer. Still has ovaries. Breast cancer: Mammogram done at gynecology Dr. [facility] - done every November CRS: Colonoscopy in 2014 with Dr. Collene Mares and then done again this year and just had 1 polyp in 2017 so told no need to repeat for 10 years. Immunizations: TDaP 2014, flu shot done annually at work. OTC meds/supp/vit: none Exercise: Goes backpacking on the [place] trail about 1x/mo Dentist: none and no plans to change that  Chronic Medical Conditions: Tobacco use: Quit smoking and vaping around 09/2012 Vit D def: not currently supplementing.  Her mother is [name] who is also a patient of mine  Past Medical History:  Diagnosis Date  . Blood transfusion complicating pregnancy [id number]   [facility] in [place], unsure number of units transfused  . Heart murmur    as a child - never any problems  . SVD (spontaneous vaginal delivery)    x 2  . Vitamin D deficiency    Past Surgical History:  Procedure Laterality Date  . ABDOMINAL HYSTERECTOMY    . COLONOSCOPY    . DILATION AND CURETTAGE OF UTERUS    . ROBOTIC ASSISTED TOTAL HYSTERECTOMY N/A 03/14/2014   Procedure: ROBOTIC ASSISTED TOTAL HYSTERECTOMY WITH BILATERAL SALPINGECTOMY, removal of right ovarian cyst, uterosacral ligment suspension;  Surgeon: Lovenia Kim, MD;  Location: [facility];  Service: Gynecology;  Laterality: N/A;  . WISDOM TOOTH EXTRACTION     Current  Outpatient Prescriptions on File Prior to Visit  Medication Sig Dispense Refill  . b complex vitamins tablet Take 1 tablet by mouth daily.    Marland Kitchen CALCIUM PO Take 1,000 mg by mouth.    . cholecalciferol (VITAMIN D) 1000 UNITS tablet Take 2,000 Units by mouth daily.      No current facility-administered medications on file prior to visit.    Allergies  Allergen Reactions  . Penicillins Other (See Comments)    Childhood rxn --- Hives  . Chlorine Rash   Family History  Problem Relation Age of Onset  . COPD Mother   . Hyperlipidemia Mother   . Cancer Mother   . Cancer Maternal Grandmother    Social History   Social History  . Marital status: Single    Spouse name: N/A  . Number of children: N/A  . 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 Smoking status: Former Smoker    Packs/day: 1.00    Years: 32.00    Types: Cigarettes    Quit date: 07/28/2012  . Smokeless tobacco: Never Used  . Alcohol use No  . Drug use: No  . Sexual activity: Yes    Birth control/ protection: Post-menopausal   Other Topics Concern  . None   Social History Narrative  . None    Review of Systems  Constitutional: Positive for  diaphoresis.  All other systems reviewed and are negative.      Objective:   Physical Exam  Constitutional: She is oriented to person, place, and time. She appears well-developed and well-nourished. No distress.  HENT:  Head: Normocephalic and atraumatic.  Right Ear: Tympanic membrane, external ear and ear canal normal.  Left Ear: Tympanic membrane, external ear and ear canal normal.  Nose: Nose normal. No mucosal edema or rhinorrhea.  Mouth/Throat: Uvula is midline, oropharynx is clear and moist and mucous membranes are normal. No posterior oropharyngeal erythema.  Eyes: Conjunctivae and EOM are normal. Pupils are equal, round, and reactive to light. Right eye exhibits no discharge. Left eye exhibits no  discharge. No scleral icterus.  Neck: Normal range of motion. Neck supple. No thyromegaly present.  Cardiovascular: Normal rate, regular rhythm and intact distal pulses.   Murmur (RUSB) heard.  Decrescendo systolic murmur is present with a grade of 1/6  Pulmonary/Chest: Effort normal and breath sounds normal. No respiratory distress.  Abdominal: Soft. Bowel sounds are normal. There is no tenderness.  Musculoskeletal: She exhibits no edema.  Lymphadenopathy:    She has no cervical adenopathy.  Neurological: She is alert and oriented to person, place, and time. She has normal reflexes.  Skin: Skin is warm and dry. She is not diaphoretic. No erythema.  Psychiatric: She has a normal mood and affect. Her behavior is normal.   Heart murmur - very faint - suspect physiologic    BP 120/80   Pulse 82   Temp 98.5 F (36.9 C) (Oral)   Resp 16   Ht 5' 1.5" (1.562 m)   Wt 158 lb 3.2 oz (71.8 kg)   LMP 03/14/2014   SpO2 97%   BMI 29.41 kg/m   Assessment & Plan:   flu shot at work, mammogram at Dr. Kennith Maes. Work form completed.  1. Annual physical exam   2. Routine screening for STI (sexually transmitted infection)   3. Screening for cardiovascular, respiratory, and genitourinary diseases - suspect physiologic murmur. Would rec getting a baseline EKG next yr at CPE  4. Screening for colorectal cancer   5. Screening for deficiency anemia   6. Screening for thyroid disorder   7. Vitamin D deficiency - no longer supplementing but lots of time outdoors, check next yr.  103.      H/o tob abuse - stopped 2014, will qualify for lung cancer screening CT after 55 yo.  Orders Placed This Encounter  Procedures  . TSH  . Lipid panel    Order Specific Question:   Has the patient fasted?    Answer:   Yes  . HIV antibody  . Comprehensive metabolic panel    Order Specific Question:   Has the patient fasted?    Answer:   Yes  . CBC  . Hepatitis C Antibody  . Hemoglobin A1c  . Care  order/instruction:    AVS printed - let patient go!  Marland Kitchen POCT urinalysis dipstick    Delman Cheadle, M.D.  [facility] [address] [place], [place] [address] [phone] phone [phone] fax  02/12/16 10:07 AM

## 2016-02-12 ENCOUNTER — Encounter: Payer: Self-pay | Admitting: Family Medicine

## 2016-02-12 ENCOUNTER — Ambulatory Visit (INDEPENDENT_AMBULATORY_CARE_PROVIDER_SITE_OTHER): Payer: Managed Care, Other (non HMO) | Admitting: Family Medicine

## 2016-02-12 VITALS — BP 120/80 | HR 82 | Temp 98.5°F | Resp 16 | Ht 61.5 in | Wt 158.2 lb

## 2016-02-12 DIAGNOSIS — Z1389 Encounter for screening for other disorder: Secondary | ICD-10-CM

## 2016-02-12 DIAGNOSIS — Z113 Encounter for screening for infections with a predominantly sexual mode of transmission: Secondary | ICD-10-CM | POA: Diagnosis not present

## 2016-02-12 DIAGNOSIS — Z130 Encounter for screening for diseases of the blood and blood-forming organs and certain disorders involving the immune mechanism: Secondary | ICD-10-CM | POA: Diagnosis not present

## 2016-02-12 DIAGNOSIS — Z1212 Encounter for screening for malignant neoplasm of rectum: Secondary | ICD-10-CM | POA: Diagnosis not present

## 2016-02-12 DIAGNOSIS — E559 Vitamin D deficiency, unspecified: Secondary | ICD-10-CM

## 2016-02-12 DIAGNOSIS — Z0000 Encounter for general adult medical examination without abnormal findings: Secondary | ICD-10-CM

## 2016-02-12 DIAGNOSIS — Z1329 Encounter for screening for other suspected endocrine disorder: Secondary | ICD-10-CM | POA: Diagnosis not present

## 2016-02-12 DIAGNOSIS — Z1383 Encounter for screening for respiratory disorder NEC: Secondary | ICD-10-CM | POA: Diagnosis not present

## 2016-02-12 DIAGNOSIS — Z136 Encounter for screening for cardiovascular disorders: Secondary | ICD-10-CM

## 2016-02-12 DIAGNOSIS — Z1211 Encounter for screening for malignant neoplasm of colon: Secondary | ICD-10-CM | POA: Diagnosis not present

## 2016-02-12 LAB — COMPREHENSIVE METABOLIC PANEL
ALT: 11 U/L (ref 6–29)
AST: 16 U/L (ref 10–35)
Albumin: 4.4 g/dL (ref 3.6–5.1)
Alkaline Phosphatase: 68 U/L (ref 33–130)
BUN: 14 mg/dL (ref 7–25)
CO2: 23 mmol/L (ref 20–31)
CREATININE: 0.63 mg/dL (ref 0.50–1.05)
Calcium: 9.1 mg/dL (ref 8.6–10.4)
Chloride: 103 mmol/L (ref 98–110)
GLUCOSE: 101 mg/dL — AB (ref 65–99)
POTASSIUM: 4.1 mmol/L (ref 3.5–5.3)
SODIUM: 137 mmol/L (ref 135–146)
TOTAL PROTEIN: 6.8 g/dL (ref 6.1–8.1)
Total Bilirubin: 0.6 mg/dL (ref 0.2–1.2)

## 2016-02-12 LAB — LIPID PANEL
Cholesterol: 292 mg/dL — ABNORMAL HIGH (ref 125–200)
HDL: 91 mg/dL (ref >=46)
LDL Cholesterol: 189 mg/dL — ABNORMAL HIGH (ref <130)
Total CHOL/HDL Ratio: 3.2 Ratio (ref <=5.0)
Triglycerides: 62 mg/dL (ref <150)
VLDL: 12 mg/dL (ref <30)

## 2016-02-12 LAB — CBC
HEMATOCRIT: 42.3 % (ref 35.0–45.0)
HEMOGLOBIN: 14.2 g/dL (ref 11.7–15.5)
MCH: 29 pg (ref 27.0–33.0)
MCHC: 33.6 g/dL (ref 32.0–36.0)
MCV: 86.5 fL (ref 80.0–100.0)
MPV: 8.9 fL (ref 7.5–12.5)
Platelets: 265 10*3/uL (ref 140–400)
RBC: 4.89 MIL/uL (ref 3.80–5.10)
RDW: 13.3 % (ref 11.0–15.0)
WBC: 4.3 10*3/uL (ref 3.8–10.8)

## 2016-02-12 LAB — POCT URINALYSIS DIP (MANUAL ENTRY)
BILIRUBIN UA: NEGATIVE
Glucose, UA: NEGATIVE
Ketones, POC UA: NEGATIVE
Leukocytes, UA: NEGATIVE
Nitrite, UA: NEGATIVE
PROTEIN UA: NEGATIVE
SPEC GRAV UA: 1.015
Urobilinogen, UA: 0.2
pH, UA: 7

## 2016-02-12 LAB — TSH: TSH: 2.14 m[IU]/L

## 2016-02-12 LAB — HEPATITIS C ANTIBODY: HCV Ab: NEGATIVE

## 2016-02-12 NOTE — Patient Instructions (Addendum)
IF you received an x-ray today, you will receive an invoice from [facility]. Please contact [facility] at [phone] with questions or concerns regarding your invoice.   IF you received labwork today, you will receive an invoice from [employer]. Please contact [facility] at [phone] with questions or concerns regarding your invoice.   Our billing staff will not be able to assist you with questions regarding bills from these companies.  You will be contacted with the lab results as soon as they are available. The fastest way to get your results is to activate your My Chart account. Instructions are located on the last page of this paperwork. If you have not heard from [place] regarding the results in 2 weeks, please contact this office.    Menopause is a normal process in which your reproductive ability comes to an end. This process happens gradually over a span of months to years, usually between the ages of 65 and 57. Menopause is complete when you have missed 12 consecutive menstrual periods. It is important to talk with your health care provider about some of the most common conditions that affect postmenopausal women, such as heart disease, cancer, and bone loss (osteoporosis). Adopting a healthy lifestyle and getting preventive care can help to promote your health and wellness. Those actions can also lower your chances of developing some of these common conditions. WHAT SHOULD I KNOW ABOUT MENOPAUSE? During menopause, you may experience a number of symptoms, such as:  Moderate-to-severe hot flashes.  Night sweats.  Decrease in sex drive.  Mood swings.  Headaches.  Tiredness.  Irritability.  Memory problems.  Insomnia. Choosing to treat or not to treat menopausal changes is an individual decision that you make with your health care provider. WHAT SHOULD I KNOW ABOUT HORMONE REPLACEMENT THERAPY AND SUPPLEMENTS? Hormone therapy  products are effective for treating symptoms that are associated with menopause, such as hot flashes and night sweats. Hormone replacement carries certain risks, especially as you become older. If you are thinking about using estrogen or estrogen with progestin treatments, discuss the benefits and risks with your health care provider. WHAT SHOULD I KNOW ABOUT HEART DISEASE AND STROKE? Heart disease, heart attack, and stroke become more likely as you age. This may be due, in part, to the hormonal changes that your body experiences during menopause. These can affect how your body processes dietary fats, triglycerides, and cholesterol. Heart attack and stroke are both medical emergencies. There are many things that you can do to help prevent heart disease and stroke:  Have your blood pressure checked at least every 1-2 years. High blood pressure causes heart disease and increases the risk of stroke.  If you are 82-15 years old, ask your health care provider if you should take aspirin to prevent a heart attack or a stroke.  Do not use any tobacco products, including cigarettes, chewing tobacco, or electronic cigarettes. If you need help quitting, ask your health care provider.  It is important to eat a healthy diet and maintain a healthy weight.  Be sure to include plenty of vegetables, fruits, low-fat dairy products, and lean protein.  Avoid eating foods that are high in solid fats, added sugars, or salt (sodium).  Get regular exercise. This is one of the most important things that you can do for your health.  Try to exercise for at least 150 minutes each week. The type of exercise that you do should increase your heart rate and make  you sweat. This is known as moderate-intensity exercise.  Try to do strengthening exercises at least twice each week. Do these in addition to the moderate-intensity exercise.  Know your numbers.Ask your health care provider to check your cholesterol and your blood  glucose. Continue to have your blood tested as directed by your health care provider. WHAT SHOULD I KNOW ABOUT CANCER SCREENING? There are several types of cancer. Take the following steps to reduce your risk and to catch any cancer development as early as possible. Breast Cancer  Practice breast self-awareness.  This means understanding how your breasts normally appear and feel.  It also means doing regular breast self-exams. Let your health care provider know about any changes, no matter how small.  If you are 48 or older, have a clinician do a breast exam (clinical breast exam or CBE) every year. Depending on your age, family history, and medical history, it may be recommended that you also have a yearly breast X-ray (mammogram).  If you have a family history of breast cancer, talk with your health care provider about genetic screening.  If you are at high risk for breast cancer, talk with your health care provider about having an MRI and a mammogram every year.  Breast cancer (BRCA) gene test is recommended for women who have family members with BRCA-related cancers. Results of the assessment will determine the need for genetic counseling and BRCA1 and for BRCA2 testing. BRCA-related cancers include these types:  Breast. This occurs in males or females.  Ovarian.  Tubal. This may also be called fallopian tube cancer.  Cancer of the abdominal or pelvic lining (peritoneal cancer).  Prostate.  Pancreatic. Cervical, Uterine, and Ovarian Cancer Your health care provider may recommend that you be screened regularly for cancer of the pelvic organs. These include your ovaries, uterus, and vagina. This screening involves a pelvic exam, which includes checking for microscopic changes to the surface of your cervix (Pap test).  For women ages 21-65, health care providers may recommend a pelvic exam and a Pap test every three years. For women ages 27-65, they may recommend the Pap test and  pelvic exam, combined with testing for human papilloma virus (HPV), every five years. Some types of HPV increase your risk of cervical cancer. Testing for HPV may also be done on women of any age who have unclear Pap test results.  Other health care providers may not recommend any screening for nonpregnant women who are considered low risk for pelvic cancer and have no symptoms. Ask your health care provider if a screening pelvic exam is right for you.  If you have had past treatment for cervical cancer or a condition that could lead to cancer, you need Pap tests and screening for cancer for at least 20 years after your treatment. If Pap tests have been discontinued for you, your risk factors (such as having a new sexual partner) need to be reassessed to determine if you should start having screenings again. Some women have medical problems that increase the chance of getting cervical cancer. In these cases, your health care provider may recommend that you have screening and Pap tests more often.  If you have a family history of uterine cancer or ovarian cancer, talk with your health care provider about genetic screening.  If you have vaginal bleeding after reaching menopause, tell your health care provider.  There are currently no reliable tests available to screen for ovarian cancer. Lung Cancer Lung cancer screening is recommended for  adults 55-80 years old who are at high risk for lung cancer because of a history of smoking. A yearly low-dose CT scan of the lungs is recommended if you:  Currently smoke.  Have a history of at least 30 pack-years of smoking and you currently smoke or have quit within the past 15 years. A pack-year is smoking an average of one pack of cigarettes per day for one year. Yearly screening should:  Continue until it has been 15 years since you quit.  Stop if you develop a health problem that would prevent you from having lung cancer treatment. Colorectal  Cancer  This type of cancer can be detected and can often be prevented.  Routine colorectal cancer screening usually begins at age 50 and continues through age 75.  If you have risk factors for colon cancer, your health care provider may recommend that you be screened at an earlier age.  If you have a family history of colorectal cancer, talk with your health care provider about genetic screening.  Your health care provider may also recommend using home test kits to check for hidden blood in your stool.  A small camera at the end of a tube can be used to examine your colon directly (sigmoidoscopy or colonoscopy). This is done to check for the earliest forms of colorectal cancer.  Direct examination of the colon should be repeated every 5-10 years until age 75. However, if early forms of precancerous polyps or small growths are found or if you have a family history or genetic risk for colorectal cancer, you may need to be screened more often. Skin Cancer  Check your skin from head to toe regularly.  Monitor any moles. Be sure to tell your health care provider:  About any new moles or changes in moles, especially if there is a change in a mole's shape or color.  If you have a mole that is larger than the size of a pencil eraser.  If any of your family members has a history of skin cancer, especially at a young age, talk with your health care provider about genetic screening.  Always use sunscreen. Apply sunscreen liberally and repeatedly throughout the day.  Whenever you are outside, protect yourself by wearing long sleeves, pants, a wide-brimmed hat, and sunglasses. WHAT SHOULD I KNOW ABOUT OSTEOPOROSIS? Osteoporosis is a condition in which bone destruction happens more quickly than new bone creation. After menopause, you may be at an increased risk for osteoporosis. To help prevent osteoporosis or the bone fractures that can happen because of osteoporosis, the following is  recommended:  If you are 19-50 years old, get at least 1,000 mg of calcium and at least 600 mg of vitamin D per day.  If you are older than age 50 but younger than age 70, get at least 1,200 mg of calcium and at least 600 mg of vitamin D per day.  If you are older than age 70, get at least 1,200 mg of calcium and at least 800 mg of vitamin D per day. Smoking and excessive alcohol intake increase the risk of osteoporosis. Eat foods that are rich in calcium and vitamin D, and do weight-bearing exercises several times each week as directed by your health care provider. WHAT SHOULD I KNOW ABOUT HOW MENOPAUSE AFFECTS MY MENTAL HEALTH? Depression may occur at any age, but it is more common as you become older. Common symptoms of depression include:  Low or sad mood.  Changes in sleep patterns.    Changes in appetite or eating patterns.  Feeling an overall lack of motivation or enjoyment of activities that you previously enjoyed.  Frequent crying spells. Talk with your health care provider if you think that you are experiencing depression. WHAT SHOULD I KNOW ABOUT IMMUNIZATIONS? It is important that you get and maintain your immunizations. These include:  Tetanus, diphtheria, and pertussis (Tdap) booster vaccine.  Influenza every year before the flu season begins.  Pneumonia vaccine.  Shingles vaccine. Your health care provider may also recommend other immunizations.   This information is not intended to replace advice given to you by your health care provider. Make sure you discuss any questions you have with your health care provider.   Document Released: 06/18/2005 Document Revised: 05/17/2014 Document Reviewed: 12/27/2013 Elsevier Interactive Patient Education [employer].

## 2016-02-13 LAB — HEMOGLOBIN A1C
Hgb A1c MFr Bld: 5.6 %
Mean Plasma Glucose: 114 mg/dL

## 2016-02-13 LAB — HIV ANTIBODY (ROUTINE TESTING W REFLEX): HIV 1&2 Ab, 4th Generation: NONREACTIVE

## 2016-03-08 ENCOUNTER — Encounter: Payer: Self-pay | Admitting: Family Medicine

## 2016-07-09 ENCOUNTER — Ambulatory Visit (INDEPENDENT_AMBULATORY_CARE_PROVIDER_SITE_OTHER): Payer: Managed Care, Other (non HMO) | Admitting: Family Medicine

## 2016-07-09 VITALS — BP 126/76 | HR 67 | Temp 98.0°F | Ht 61.5 in | Wt 163.4 lb

## 2016-07-09 DIAGNOSIS — H1032 Unspecified acute conjunctivitis, left eye: Secondary | ICD-10-CM | POA: Diagnosis not present

## 2016-07-09 MED ORDER — BACITRACIN-POLYMYXIN B 500-10000 UNIT/GM OP OINT: 1.0000 "application " | TOPICAL_OINTMENT | Freq: Three times a day (TID) | OPHTHALMIC | 0 refills | Status: DC

## 2016-07-09 NOTE — Progress Notes (Signed)
Patient ID: Briana Meadows, female    DOB: 03/08/1961, 56 y.o.   MRN: [id number]  PCP: Delman Cheadle, MD  Chief Complaint  Patient presents with  . Eye Pain    X 2 days right eye    Subjective:  HPI 55 year old female presents for evaluation of right eye pain x 2 days. Pt reports this is the third incident of right eye infection she has experienced this year. She reports itching and burning of right eye, swelling , watery drainage with crusting. Denies fever or recent upper respiratory infection.  She recently has previously used over the counter eye drops which only temporally resolved infection.   Social History   Social History  . Marital status: Single    Spouse name: N/A  . Number of children: N/A  . 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 Smoking status: Former Smoker    Packs/day: 1.00    Years: 32.00    Types: Cigarettes    Quit date: 07/28/2012  . Smokeless tobacco: Never Used  . Alcohol use No  . Drug use: No  . Sexual activity: Yes    Birth control/ protection: Post-menopausal   Other Topics Concern  . Not on file   Social History Narrative  . No narrative on file    Family History  Problem Relation Age of Onset  . COPD Mother   . Hyperlipidemia Mother   . Cancer Mother   . Cancer Maternal Grandmother    Review of Systems See HPI  Patient Active Problem List   Diagnosis Date Noted  . Vitamin D deficiency 02/11/2016  . Fibroids 03/14/2014    Allergies  Allergen Reactions  . Penicillins Other (See Comments)    Childhood rxn --- Hives  . Chlorine Rash    Prior to Admission medications   Medication Sig Start Date End Date Taking? Authorizing Provider  cholecalciferol (VITAMIN D) 1000 UNITS tablet Take 2,000 Units by mouth daily.    Yes Historical Provider, MD  b complex vitamins tablet Take 1 tablet by mouth daily.    Historical Provider, MD  CALCIUM PO Take 1,000 mg by mouth.     Historical Provider, MD    Past Medical, Surgical Family and Social History reviewed and updated.    Objective:   Today's Vitals   07/09/16 1215  BP: 126/76  Pulse: 67  Temp: 98 F (36.7 C)  TempSrc: Oral  SpO2: 97%  Weight: 163 lb 6.4 oz (74.1 kg)  Height: 5' 1.5" (1.562 m)    Wt Readings from Last 3 Encounters:  07/09/16 163 lb 6.4 oz (74.1 kg)  02/12/16 158 lb 3.2 oz (71.8 kg)  03/14/14 169 lb (76.7 kg)    Physical Exam  Constitutional: She appears well-developed and well-nourished.  Eyes: Pupils are equal, round, and reactive to light. Left eye exhibits discharge and exudate. Left eye exhibits no chemosis and no hordeolum. No foreign body present in the left eye. Left conjunctiva is injected. Left eye exhibits normal extraocular motion and no nystagmus.  Cardiovascular: Normal rate, regular rhythm, normal heart sounds and intact distal pulses.   Pulmonary/Chest: Effort normal and breath sounds normal.  Musculoskeletal: Normal range of motion.  Skin: Skin is warm and dry.  Psychiatric: She has a normal mood and affect. Her behavior is normal. Thought content normal.     Assessment & Plan:  1. Acute conjunctivitis of left eye, unspecified acute  conjunctivitis type  Plan:  Apply one ribbon to lower eye lid three times daily x 7 days for eye infection.  If infections returns, follow-up here and we will refer you to a opthalmology.    Carroll Sage. Kenton Kingfisher, MSN, FNP-C [facility]

## 2016-07-09 NOTE — Patient Instructions (Addendum)
Apply one ribbon to lower eye lid three times daily x 7 days for eye infection.  If infections returns, follow-up here and we will refer you to a opthalmology.     IF you received an x-ray today, you will receive an invoice from [facility]. Please contact [facility] at [phone] with questions or concerns regarding your invoice.   IF you received labwork today, you will receive an invoice from [facility]. Please contact [facility] at [phone] with questions or concerns regarding your invoice.   Our billing staff will not be able to assist you with questions regarding bills from these companies.  You will be contacted with the lab results as soon as they are available. The fastest way to get your results is to activate your My Chart account. Instructions are located on the last page of this paperwork. If you have not heard from [place] regarding the results in 2 weeks, please contact this office.      Bacterial Conjunctivitis Bacterial conjunctivitis is an infection of your conjunctiva. This is the clear membrane that covers the white part of your eye and the inner surface of your eyelid. This condition can make your eye:  Red or pink.  Itchy. This condition is caused by bacteria. This condition spreads very easily from person to person (is contagious) and from one eye to the other eye. Follow these instructions at home: Medicines   Take or apply your antibiotic medicine as told by your doctor. Do not stop taking or applying the antibiotic even if you start to feel better.  Take or apply over-the-counter and prescription medicines only as told by your doctor.  Do not touch your eyelid with the eye drop bottle or the ointment tube. Managing discomfort   Wipe any fluid from your eye with a warm, wet washcloth or a cotton ball.  Place a cool, clean washcloth on your eye. Do this for 10-20 minutes, 3-4 times per day. General instructions   Do not wear contact  lenses until the irritation is gone. Wear glasses until your doctor says it is okay to wear contacts.  Do not wear eye makeup until your symptoms are gone. Throw away any old makeup.  Change or wash your pillowcase every day.  Do not share towels or washcloths with anyone.  Wash your hands often with soap and water. Use paper towels to dry your hands.  Do not touch or rub your eyes.  Do not drive or use heavy machinery if your vision is blurry. Contact a doctor if:  You have a fever.  Your symptoms do not get better after 10 days. Get help right away if:  You have a fever and your symptoms suddenly get worse.  You have very bad pain when you move your eye.  Your face:  Hurts.  Is red.  Is swollen.  You have sudden loss of vision. This information is not intended to replace advice given to you by your health care provider. Make sure you discuss any questions you have with your health care provider. Document Released: 02/03/2008 Document Revised: 10/02/2015 Document Reviewed: 02/06/2015 Elsevier Interactive Patient Education  2017 [employer].

## 2017-02-14 ENCOUNTER — Ambulatory Visit (INDEPENDENT_AMBULATORY_CARE_PROVIDER_SITE_OTHER): Payer: Managed Care, Other (non HMO) | Admitting: Family Medicine

## 2017-02-14 ENCOUNTER — Encounter: Payer: Self-pay | Admitting: Family Medicine

## 2017-02-14 VITALS — BP 120/74 | HR 73 | Temp 98.5°F | Resp 16 | Ht 61.0 in | Wt 159.4 lb

## 2017-02-14 DIAGNOSIS — Z124 Encounter for screening for malignant neoplasm of cervix: Secondary | ICD-10-CM

## 2017-02-14 DIAGNOSIS — H5711 Ocular pain, right eye: Secondary | ICD-10-CM | POA: Diagnosis not present

## 2017-02-14 DIAGNOSIS — Z136 Encounter for screening for cardiovascular disorders: Secondary | ICD-10-CM

## 2017-02-14 DIAGNOSIS — Z130 Encounter for screening for diseases of the blood and blood-forming organs and certain disorders involving the immune mechanism: Secondary | ICD-10-CM

## 2017-02-14 DIAGNOSIS — Z131 Encounter for screening for diabetes mellitus: Secondary | ICD-10-CM | POA: Diagnosis not present

## 2017-02-14 DIAGNOSIS — E7800 Pure hypercholesterolemia, unspecified: Secondary | ICD-10-CM

## 2017-02-14 DIAGNOSIS — Z1383 Encounter for screening for respiratory disorder NEC: Secondary | ICD-10-CM

## 2017-02-14 DIAGNOSIS — Z1231 Encounter for screening mammogram for malignant neoplasm of breast: Secondary | ICD-10-CM

## 2017-02-14 DIAGNOSIS — Z113 Encounter for screening for infections with a predominantly sexual mode of transmission: Secondary | ICD-10-CM

## 2017-02-14 DIAGNOSIS — E559 Vitamin D deficiency, unspecified: Secondary | ICD-10-CM

## 2017-02-14 DIAGNOSIS — Z0000 Encounter for general adult medical examination without abnormal findings: Secondary | ICD-10-CM | POA: Diagnosis not present

## 2017-02-14 DIAGNOSIS — Z1211 Encounter for screening for malignant neoplasm of colon: Secondary | ICD-10-CM | POA: Diagnosis not present

## 2017-02-14 DIAGNOSIS — Z1329 Encounter for screening for other suspected endocrine disorder: Secondary | ICD-10-CM

## 2017-02-14 DIAGNOSIS — Z1389 Encounter for screening for other disorder: Secondary | ICD-10-CM | POA: Diagnosis not present

## 2017-02-14 DIAGNOSIS — Z1212 Encounter for screening for malignant neoplasm of rectum: Secondary | ICD-10-CM

## 2017-02-14 NOTE — Progress Notes (Signed)
Subjective:    Patient ID: Briana Meadows, female    DOB: December 10, 1960, 56 y.o.   MRN: [id number]   Chief Complaint  Patient presents with  . Annual Exam    HPI  Sonia Baller is a 56 yo woman who I last saw 1 year prior for her CPE.    Primary Preventative Screenings: Cervical Cancer: Pap done at gynecology Dr. Ronita Hipps. No h/o abnml. Underwent total hysterectomy with BTL 03/2014 for postmenopausal bleeding - +fibroids but no cancer. Still has ovaries. Family Planning: s/p hysterectomy STI screening: neg Hep C and HIV 2017 Breast Cancer: Mammogram done at gyn Dr. [facility] every November Colorectal Cancer: Colonoscopy in 2014 with Dr. Collene Mares and repeated 2017 w/ just 1 polyp so told repeat in 10 yrs. Tobacco use/EtOH/substances: + h/o smoking/vaping tobacco - quit 09/2012. will qualify for lung cancer screening CT after next bday (May 2019). Bone Density: no calcium/vit D supp. Doesn't do a lot of dairy - some cheese.  Cardiac: Weight/Blood sugar/Diet/Exercise:Does not eat breakfast ever. [place]es [place] about 1x/mo for exercise. BMI Readings from Last 3 Encounters:  07/09/16 30.37 kg/m  02/12/16 29.41 kg/m  03/14/14 33.01 kg/m   Lab Results  Component Value Date   HGBA1C 5.6 02/12/2016   OTC/Vit/Supp/Herbal: none Dentist/Optho: does not see dentist Immunizations: flu shot done annually at work. Immunization History  Administered Date(s) Administered  . Influenza,inj,Quad PF,6+ Mos 03/08/2014  . Influenza-Unspecified 02/04/2016  . Tdap 09/01/2012     Chronic Medical Conditions: Vit D def: not currently supplementing.  Her mother is [name] who is also a patient of mine  Past Medical History:  Diagnosis Date  . Blood transfusion complicating pregnancy 07/2669   [facility] in [place], unsure number of units transfused  . Heart murmur    as a child - never any problems  . SVD (spontaneous vaginal delivery)    x 2  . Vitamin D  deficiency    Past Surgical History:  Procedure Laterality Date  . ABDOMINAL HYSTERECTOMY    . COLONOSCOPY    . DILATION AND CURETTAGE OF UTERUS    . ROBOTIC ASSISTED TOTAL HYSTERECTOMY N/A 03/14/2014   Procedure: ROBOTIC ASSISTED TOTAL HYSTERECTOMY WITH BILATERAL SALPINGECTOMY, removal of right ovarian cyst, uterosacral ligment suspension;  Surgeon: Lovenia Kim, MD;  Location: [facility];  Service: Gynecology;  Laterality: N/A;  . WISDOM TOOTH EXTRACTION     Current Outpatient Prescriptions on File Prior to Visit  Medication Sig Dispense Refill  . b complex vitamins tablet Take 1 tablet by mouth daily.    . bacitracin-polymyxin b (POLYSPORIN) ophthalmic ointment Place 1 application into the right eye 3 (three) times daily. 3.5 g 0  . CALCIUM PO Take 1,000 mg by mouth.    . cholecalciferol (VITAMIN D) 1000 UNITS tablet Take 2,000 Units by mouth daily.      No current facility-administered medications on file prior to visit.    Allergies  Allergen Reactions  . Penicillins Other (See Comments)    Childhood rxn --- Hives  . Chlorine Rash   Family History  Problem Relation Age of Onset  . COPD Mother   . Hyperlipidemia Mother   . Cancer Mother   . Cancer Maternal Grandmother    Social History   Social History  . Marital status: Single    Spouse name: N/A  . Number of children: N/A  . Years of education: N/A   Occupational History  . social services  Social History Main Topics  . Smoking status: Former Smoker    Packs/day: 1.00    Years: 32.00    Types: Cigarettes    Quit date: 07/28/2012  . Smokeless tobacco: Never Used  . Alcohol use No  . Drug use: No  . Sexual activity: Yes    Birth control/ protection: Post-menopausal   Other Topics Concern  . Not on file   Social History Narrative  . No narrative on file   Depression screen [facility] 2/9 02/14/2017 07/09/2016 02/12/2016  Decreased Interest 0 0 0  Down, Depressed, Hopeless 0 0 0  PHQ - 2 Score 0 0 0     Review of Systems  Constitutional: Positive for diaphoresis.  All other systems reviewed and are negative. eye pain - wonders if it is RCE - recurrent corneal erosion - using Muro gtts for inflammation.   She was seen by the eye doc in [place] - only visit - last Jan but told she just had an eyelash so was seen here for conjunctivitis.      Objective:   Physical Exam  Constitutional: She is oriented to person, place, and time. She appears well-developed and well-nourished. No distress.  HENT:  Head: Normocephalic and atraumatic.  Right Ear: Tympanic membrane, external ear and ear canal normal.  Left Ear: Tympanic membrane, external ear and ear canal normal.  Nose: Nose normal. No mucosal edema or rhinorrhea.  Mouth/Throat: Uvula is midline, oropharynx is clear and moist and mucous membranes are normal. No posterior oropharyngeal erythema.  Eyes: Pupils are equal, round, and reactive to light. Conjunctivae and EOM are normal. Right eye exhibits no discharge. Left eye exhibits no discharge. No scleral icterus.  Neck: Normal range of motion. Neck supple. No thyromegaly present.  Cardiovascular: Normal rate, regular rhythm and intact distal pulses.   No murmur heard. Pulmonary/Chest: Effort normal and breath sounds normal. No respiratory distress.  Abdominal: Soft. Bowel sounds are normal. There is no tenderness.  Musculoskeletal: She exhibits no edema.  Lymphadenopathy:    She has no cervical adenopathy.  Neurological: She is alert and oriented to person, place, and time. She has normal reflexes.  Skin: Skin is warm and dry. She is not diaphoretic. No erythema.  Psychiatric: She has a normal mood and affect. Her behavior is normal.      BP 120/74 (BP Location: Left Arm, Patient Position: Sitting, Cuff Size: Large)   Pulse 73   Temp 98.5 F (36.9 C) (Oral)   Resp 16   Ht 5\' 1"  (1.549 m)   Wt 159 lb 6.4 oz (72.3 kg)   LMP 03/14/2014   SpO2 97%   BMI 30.12 kg/m   EKG: Sinus  bradycardia, no acute ischemic changes noted. No prior EKG available for comparison. I have personally reviewed the EKG tracing and agree with the computer interpretation.  Assessment & Plan:  flu shot at work???, mammogram at Dr. Kennith Maes. EKG due to murmur last yr Vit D, ua, cbc, lipid, cmp, tsh  1. Annual physical exam   2. Routine screening for STI (sexually transmitted infection)   3. Encounter for screening mammogram for breast cancer   4. Screening for cervical cancer   5. Screening for cardiovascular, respiratory, and genitourinary diseases   6. Screening for colorectal cancer   7. Screening for deficiency anemia   8. Screening for thyroid disorder   9. Vitamin D deficiency   10. Pure hypercholesterolemia   11. Pain of right eye   12. Screening for diabetes  mellitus    Completed work form Surveyor, quantity and returned to pt.  Orders Placed This Encounter  Procedures  . VITAMIN D 25 Hydroxy (Vit-D Deficiency, Fractures)  . CBC with Differential/Platelet  . Comprehensive metabolic panel    Order Specific Question:   Has the patient fasted?    Answer:   Yes  . Lipid panel    Order Specific Question:   Has the patient fasted?    Answer:   Yes  . TSH  . Hemoglobin A1c  . Ambulatory referral to Ophthalmology    Referral Priority:   Routine    Referral Type:   Consultation    Referral Reason:   Specialty Services Required    Requested Specialty:   Ophthalmology    Number of Visits Requested:   1  . POCT urinalysis dipstick  . EKG 12-Lead     Delman Cheadle, M.D.  Primary Care at [facility] [address] [place], [place] [address] [phone] phone [phone] fax  02/15/17 10:56 PM

## 2017-02-14 NOTE — Patient Instructions (Addendum)
IF you received an x-ray today, you will receive an invoice from [facility]. Please contact [facility] at [phone] with questions or concerns regarding your invoice.   IF you received labwork today, you will receive an invoice from [facility]. Please contact [facility] at [phone] with questions or concerns regarding your invoice.   Our billing staff will not be able to assist you with questions regarding bills from these companies.  You will be contacted with the lab results as soon as they are available. The fastest way to get your results is to activate your My Chart account. Instructions are located on the last page of this paperwork. If you have not heard from [place] regarding the results in 2 weeks, please contact this office.     Health Maintenance for Postmenopausal Women Menopause is a normal process in which your reproductive ability comes to an end. This process happens gradually over a span of months to years, usually between the ages of 46 and 20. Menopause is complete when you have missed 12 consecutive menstrual periods. It is important to talk with your health care provider about some of the most common conditions that affect postmenopausal women, such as heart disease, cancer, and bone loss (osteoporosis). Adopting a healthy lifestyle and getting preventive care can help to promote your health and wellness. Those actions can also lower your chances of developing some of these common conditions. What should I know about menopause? During menopause, you may experience a number of symptoms, such as:  Moderate-to-severe hot flashes.  Night sweats.  Decrease in sex drive.  Mood swings.  Headaches.  Tiredness.  Irritability.  Memory problems.  Insomnia.  Choosing to treat or not to treat menopausal changes is an individual decision that you make with your health care provider. What should I know about hormone replacement therapy and  supplements? Hormone therapy products are effective for treating symptoms that are associated with menopause, such as hot flashes and night sweats. Hormone replacement carries certain risks, especially as you become older. If you are thinking about using estrogen or estrogen with progestin treatments, discuss the benefits and risks with your health care provider. What should I know about heart disease and stroke? Heart disease, heart attack, and stroke become more likely as you age. This may be due, in part, to the hormonal changes that your body experiences during menopause. These can affect how your body processes dietary fats, triglycerides, and cholesterol. Heart attack and stroke are both medical emergencies. There are many things that you can do to help prevent heart disease and stroke:  Have your blood pressure checked at least every 1-2 years. High blood pressure causes heart disease and increases the risk of stroke.  If you are 31-31 years old, ask your health care provider if you should take aspirin to prevent a heart attack or a stroke.  Do not use any tobacco products, including cigarettes, chewing tobacco, or electronic cigarettes. If you need help quitting, ask your health care provider.  It is important to eat a healthy diet and maintain a healthy weight. ? Be sure to include plenty of vegetables, fruits, low-fat dairy products, and lean protein. ? Avoid eating foods that are high in solid fats, added sugars, or salt (sodium).  Get regular exercise. This is one of the most important things that you can do for your health. ? Try to exercise for at least 150 minutes each week. The type of exercise that you do should increase your  heart rate and make you sweat. This is known as moderate-intensity exercise. ? Try to do strengthening exercises at least twice each week. Do these in addition to the moderate-intensity exercise.  Know your numbers.Ask your health care provider to check  your cholesterol and your blood glucose. Continue to have your blood tested as directed by your health care provider.  What should I know about cancer screening? There are several types of cancer. Take the following steps to reduce your risk and to catch any cancer development as early as possible. Breast Cancer  Practice breast self-awareness. ? This means understanding how your breasts normally appear and feel. ? It also means doing regular breast self-exams. Let your health care provider know about any changes, no matter how small.  If you are 46 or older, have a clinician do a breast exam (clinical breast exam or CBE) every year. Depending on your age, family history, and medical history, it may be recommended that you also have a yearly breast X-ray (mammogram).  If you have a family history of breast cancer, talk with your health care provider about genetic screening.  If you are at high risk for breast cancer, talk with your health care provider about having an MRI and a mammogram every year.  Breast cancer (BRCA) gene test is recommended for women who have family members with BRCA-related cancers. Results of the assessment will determine the need for genetic counseling and BRCA1 and for BRCA2 testing. BRCA-related cancers include these types: ? Breast. This occurs in males or females. ? Ovarian. ? Tubal. This may also be called fallopian tube cancer. ? Cancer of the abdominal or pelvic lining (peritoneal cancer). ? Prostate. ? Pancreatic.  Cervical, Uterine, and Ovarian Cancer Your health care provider may recommend that you be screened regularly for cancer of the pelvic organs. These include your ovaries, uterus, and vagina. This screening involves a pelvic exam, which includes checking for microscopic changes to the surface of your cervix (Pap test).  For women ages 21-65, health care providers may recommend a pelvic exam and a Pap test every three years. For women ages 54-65,  they may recommend the Pap test and pelvic exam, combined with testing for human papilloma virus (HPV), every five years. Some types of HPV increase your risk of cervical cancer. Testing for HPV may also be done on women of any age who have unclear Pap test results.  Other health care providers may not recommend any screening for nonpregnant women who are considered low risk for pelvic cancer and have no symptoms. Ask your health care provider if a screening pelvic exam is right for you.  If you have had past treatment for cervical cancer or a condition that could lead to cancer, you need Pap tests and screening for cancer for at least 20 years after your treatment. If Pap tests have been discontinued for you, your risk factors (such as having a new sexual partner) need to be reassessed to determine if you should start having screenings again. Some women have medical problems that increase the chance of getting cervical cancer. In these cases, your health care provider may recommend that you have screening and Pap tests more often.  If you have a family history of uterine cancer or ovarian cancer, talk with your health care provider about genetic screening.  If you have vaginal bleeding after reaching menopause, tell your health care provider.  There are currently no reliable tests available to screen for ovarian cancer.  Lung  Cancer Lung cancer screening is recommended for adults 37-83 years old who are at high risk for lung cancer because of a history of smoking. A yearly low-dose CT scan of the lungs is recommended if you:  Currently smoke.  Have a history of at least 30 pack-years of smoking and you currently smoke or have quit within the past 15 years. A pack-year is smoking an average of one pack of cigarettes per day for one year.  Yearly screening should:  Continue until it has been 15 years since you quit.  Stop if you develop a health problem that would prevent you from having lung  cancer treatment.  Colorectal Cancer  This type of cancer can be detected and can often be prevented.  Routine colorectal cancer screening usually begins at age 35 and continues through age 17.  If you have risk factors for colon cancer, your health care provider may recommend that you be screened at an earlier age.  If you have a family history of colorectal cancer, talk with your health care provider about genetic screening.  Your health care provider may also recommend using home test kits to check for hidden blood in your stool.  A small camera at the end of a tube can be used to examine your colon directly (sigmoidoscopy or colonoscopy). This is done to check for the earliest forms of colorectal cancer.  Direct examination of the colon should be repeated every 5-10 years until age 12. However, if early forms of precancerous polyps or small growths are found or if you have a family history or genetic risk for colorectal cancer, you may need to be screened more often.  Skin Cancer  Check your skin from head to toe regularly.  Monitor any moles. Be sure to tell your health care provider: ? About any new moles or changes in moles, especially if there is a change in a mole's shape or color. ? If you have a mole that is larger than the size of a pencil eraser.  If any of your family members has a history of skin cancer, especially at a young age, talk with your health care provider about genetic screening.  Always use sunscreen. Apply sunscreen liberally and repeatedly throughout the day.  Whenever you are outside, protect yourself by wearing long sleeves, pants, a wide-brimmed hat, and sunglasses.  What should I know about osteoporosis? Osteoporosis is a condition in which bone destruction happens more quickly than new bone creation. After menopause, you may be at an increased risk for osteoporosis. To help prevent osteoporosis or the bone fractures that can happen because of  osteoporosis, the following is recommended:  If you are 64-78 years old, get at least 1,000 mg of calcium and at least 600 mg of vitamin D per day.  If you are older than age 76 but younger than age 26, get at least 1,200 mg of calcium and at least 600 mg of vitamin D per day.  If you are older than age 18, get at least 1,200 mg of calcium and at least 800 mg of vitamin D per day.  Smoking and excessive alcohol intake increase the risk of osteoporosis. Eat foods that are rich in calcium and vitamin D, and do weight-bearing exercises several times each week as directed by your health care provider. What should I know about how menopause affects my mental health? Depression may occur at any age, but it is more common as you become older. Common symptoms of depression  include:  Low or sad mood.  Changes in sleep patterns.  Changes in appetite or eating patterns.  Feeling an overall lack of motivation or enjoyment of activities that you previously enjoyed.  Frequent crying spells.  Talk with your health care provider if you think that you are experiencing depression. What should I know about immunizations? It is important that you get and maintain your immunizations. These include:  Tetanus, diphtheria, and pertussis (Tdap) booster vaccine.  Influenza every year before the flu season begins.  Pneumonia vaccine.  Shingles vaccine.  Your health care provider may also recommend other immunizations. This information is not intended to replace advice given to you by your health care provider. Make sure you discuss any questions you have with your health care provider. Document Released: 06/18/2005 Document Revised: 11/14/2015 Document Reviewed: 01/28/2015 Elsevier Interactive Patient Education  2018 [employer].

## 2017-02-15 ENCOUNTER — Encounter: Payer: Self-pay | Admitting: *Deleted

## 2017-02-15 LAB — CBC WITH DIFFERENTIAL/PLATELET
Basophils Absolute: 0 10*3/uL (ref 0.0–0.2)
Basos: 1 %
EOS (ABSOLUTE): 0.1 10*3/uL (ref 0.0–0.4)
EOS: 1 %
Hematocrit: 40 % (ref 34.0–46.6)
Hemoglobin: 13.1 g/dL (ref 11.1–15.9)
IMMATURE GRANS (ABS): 0 10*3/uL (ref 0.0–0.1)
Immature Granulocytes: 0 %
LYMPHS: 50 %
Lymphocytes Absolute: 3.1 10*3/uL (ref 0.7–3.1)
MCH: 28.7 pg (ref 26.6–33.0)
MCHC: 32.8 g/dL (ref 31.5–35.7)
MCV: 88 fL (ref 79–97)
Monocytes Absolute: 0.5 10*3/uL (ref 0.1–0.9)
Monocytes: 9 %
Neutrophils Absolute: 2.4 10*3/uL (ref 1.4–7.0)
Neutrophils: 39 %
Platelets: 262 10*3/uL (ref 150–379)
RBC: 4.57 x10E6/uL (ref 3.77–5.28)
RDW: 13.9 % (ref 12.3–15.4)
WBC: 6.2 10*3/uL (ref 3.4–10.8)

## 2017-02-15 LAB — COMPREHENSIVE METABOLIC PANEL
ALK PHOS: 72 IU/L (ref 39–117)
ALT: 14 IU/L (ref 0–32)
AST: 20 IU/L (ref 0–40)
Albumin/Globulin Ratio: 2 (ref 1.2–2.2)
Albumin: 4.5 g/dL (ref 3.5–5.5)
BUN/Creatinine Ratio: 11 (ref 9–23)
BUN: 9 mg/dL (ref 6–24)
Bilirubin Total: 0.5 mg/dL (ref 0.0–1.2)
CALCIUM: 9.1 mg/dL (ref 8.7–10.2)
CO2: 21 mmol/L (ref 20–29)
CREATININE: 0.79 mg/dL (ref 0.57–1.00)
Chloride: 103 mmol/L (ref 96–106)
GFR calc Af Amer: 98 mL/min/{1.73_m2} (ref >59)
GFR, EST NON AFRICAN AMERICAN: 85 mL/min/{1.73_m2} (ref >59)
GLUCOSE: 82 mg/dL (ref 65–99)
Globulin, Total: 2.2 g/dL (ref 1.5–4.5)
Potassium: 4.2 mmol/L (ref 3.5–5.2)
Sodium: 140 mmol/L (ref 134–144)
Total Protein: 6.7 g/dL (ref 6.0–8.5)

## 2017-02-15 LAB — HEMOGLOBIN A1C
Est. average glucose Bld gHb Est-mCnc: 114 mg/dL
HEMOGLOBIN A1C: 5.6 % (ref 4.8–5.6)

## 2017-02-15 LAB — LIPID PANEL
CHOLESTEROL TOTAL: 246 mg/dL — AB (ref 100–199)
Chol/HDL Ratio: 2.7 ratio (ref 0.0–4.4)
HDL: 91 mg/dL (ref >39)
LDL Calculated: 140 mg/dL — ABNORMAL HIGH (ref 0–99)
Triglycerides: 77 mg/dL (ref 0–149)
VLDL CHOLESTEROL CAL: 15 mg/dL (ref 5–40)

## 2017-02-15 LAB — VITAMIN D 25 HYDROXY (VIT D DEFICIENCY, FRACTURES): Vit D, 25-Hydroxy: 38 ng/mL (ref 30.0–100.0)

## 2017-02-15 LAB — TSH: TSH: 1.95 u[IU]/mL (ref 0.450–4.500)

## 2017-12-07 ENCOUNTER — Encounter: Payer: Self-pay | Admitting: Family Medicine

## 2017-12-07 ENCOUNTER — Telehealth: Payer: Self-pay | Admitting: Family Medicine

## 2017-12-07 NOTE — Telephone Encounter (Signed)
Left a voicemail in regards to an appt Briana Meadows has with Dr. Brigitte Pulse on 02/20/2018. Provider will not be in the office on that day and pt will need to reschedule.

## 2017-12-28 ENCOUNTER — Other Ambulatory Visit: Payer: Self-pay

## 2017-12-28 ENCOUNTER — Ambulatory Visit: Payer: Managed Care, Other (non HMO) | Admitting: Physician Assistant

## 2017-12-28 ENCOUNTER — Encounter: Payer: Self-pay | Admitting: Physician Assistant

## 2017-12-28 VITALS — BP 126/78 | HR 60 | Temp 98.1°F | Resp 16 | Ht 61.81 in | Wt 159.2 lb

## 2017-12-28 DIAGNOSIS — H1032 Unspecified acute conjunctivitis, left eye: Secondary | ICD-10-CM | POA: Diagnosis not present

## 2017-12-28 MED ORDER — BACITRACIN-POLYMYXIN B 500-10000 UNIT/GM OP OINT: 1.0000 "application " | TOPICAL_OINTMENT | Freq: Three times a day (TID) | OPHTHALMIC | 0 refills | Status: AC

## 2017-12-28 NOTE — Patient Instructions (Addendum)
  Low threshold to call your eye doctor if you don't improve with the drops.    If you have lab work done today you will be contacted with your lab results within the next 2 weeks.  If you have not heard from [place] then please contact us. The fastest way to get your results is to register for My Chart.   IF you received an x-ray today, you will receive an invoice from [facility]. Please contact [facility] at [phone] with questions or concerns regarding your invoice.   IF you received labwork today, you will receive an invoice from [facility]. Please contact [facility] at [phone] with questions or concerns regarding your invoice.   Our billing staff will not be able to assist you with questions regarding bills from these companies.  You will be contacted with the lab results as soon as they are available. The fastest way to get your results is to activate your My Chart account. Instructions are located on the last page of this paperwork. If you have not heard from [place] regarding the results in 2 weeks, please contact this office.

## 2017-12-28 NOTE — Progress Notes (Signed)
12/28/2017 4:10 PM   DOB: 08-18-1960 / MRN: [id number]  SUBJECTIVE:  Briana Meadows is a 57 y.o. female presenting for left eye irriation with mild drainage x 24 hours. She has had this in the past and reports eye drops for infection helped last time.  Denies frank eye pain, changes in vision.  Has an ophthalmologist.  She is allergic to penicillins and chlorine.   She  has a past medical history of Blood transfusion complicating pregnancy (01/3817), Heart murmur, SVD (spontaneous vaginal delivery), and Vitamin D deficiency.    She  reports that she quit smoking about 5 years ago. Her smoking use included cigarettes. She has a 32.00 pack-year smoking history. She has never used smokeless tobacco. She reports that she does not drink alcohol or use drugs. She  reports that she currently engages in sexual activity. She reports using the following method of birth control/protection: Post-menopausal. The patient  has a past surgical history that includes Dilation and curettage of uterus; Wisdom tooth extraction; Colonoscopy; Robotic assisted total hysterectomy (N/A, 03/14/2014); and Abdominal hysterectomy.  Her family history includes COPD in her mother; Cancer in her maternal grandmother and mother; Diabetes in her brother; Hyperlipidemia in her mother; Non-Hodgkin's lymphoma in her sister.  Review of Systems  Eyes: Positive for redness. Negative for blurred vision, double vision, pain and discharge.  Neurological: Negative for dizziness.    The problem list and medications were reviewed and updated by myself where necessary and exist elsewhere in the encounter.   OBJECTIVE:  BP 126/78   Pulse 60   Temp 98.1 F (36.7 C) (Oral)   Resp 16   Ht 5' 1.81" (1.57 m)   Wt 159 lb 3.2 oz (72.2 kg)   LMP 03/14/2014   SpO2 98%   BMI 29.30 kg/m   Wt Readings from Last 3 Encounters:  12/28/17 159 lb 3.2 oz (72.2 kg)  02/14/17 159 lb 6.4 oz (72.3 kg)  07/09/16 163 lb 6.4 oz (74.1 kg)   Temp  Readings from Last 3 Encounters:  12/28/17 98.1 F (36.7 C) (Oral)  02/14/17 98.5 F (36.9 C) (Oral)  07/09/16 98 F (36.7 C) (Oral)   BP Readings from Last 3 Encounters:  12/28/17 126/78  02/14/17 120/74  07/09/16 126/76   Pulse Readings from Last 3 Encounters:  12/28/17 60  02/14/17 73  07/09/16 67    Physical Exam  Constitutional: She is oriented to person, place, and time. She appears well-nourished. No distress.  Eyes: Pupils are equal, round, and reactive to light. EOM are normal.    Cardiovascular: Normal rate.  Pulmonary/Chest: Effort normal.  Abdominal: She exhibits no distension.  Neurological: She is alert and oriented to person, place, and time. No cranial nerve deficit. Gait normal.  Skin: Skin is dry. She is not diaphoretic.  Psychiatric: She has a normal mood and affect.  Vitals reviewed.   Lab Results  Component Value Date   HGBA1C 5.6 02/14/2017    Lab Results  Component Value Date   WBC 6.2 02/14/2017   HGB 13.1 02/14/2017   HCT 40.0 02/14/2017   MCV 88 02/14/2017   PLT 262 02/14/2017    Lab Results  Component Value Date   CREATININE 0.79 02/14/2017   BUN 9 02/14/2017   NA 140 02/14/2017   K 4.2 02/14/2017   CL 103 02/14/2017   CO2 21 02/14/2017    Lab Results  Component Value Date   ALT 14 02/14/2017   AST 20 02/14/2017  ALKPHOS 72 02/14/2017   BILITOT 0.5 02/14/2017    Lab Results  Component Value Date   TSH 1.950 02/14/2017    Lab Results  Component Value Date   CHOL 246 (H) 02/14/2017   HDL 91 02/14/2017   LDLCALC 140 (H) 02/14/2017   TRIG 77 02/14/2017   CHOLHDL 2.7 02/14/2017     ASSESSMENT AND PLAN:  [name] was seen today for eye pain.  Diagnoses and all orders for this visit:  Acute conjunctivitis of left eye, unspecified acute conjunctivitis type  Other orders -     bacitracin-polymyxin b (POLYSPORIN) ophthalmic ointment; Place 1 application into the right eye 3 (three) times daily.    The  patient is advised to call or return to clinic if she does not see an improvement in symptoms, or to seek the care of the closest emergency department if she worsens with the above plan.   Philis Fendt, MHS, PA-C [facility] 12/28/2017 4:10 PM

## 2018-01-20 ENCOUNTER — Telehealth: Payer: Self-pay | Admitting: Family Medicine

## 2018-01-20 NOTE — Telephone Encounter (Signed)
LVM for pt to reschedule her appt.  She may reschedule for November with Brigitte Pulse or anytime with another provider

## 2018-02-17 ENCOUNTER — Encounter: Payer: Self-pay | Admitting: Physician Assistant

## 2018-02-17 ENCOUNTER — Other Ambulatory Visit: Payer: Self-pay

## 2018-02-17 ENCOUNTER — Ambulatory Visit (INDEPENDENT_AMBULATORY_CARE_PROVIDER_SITE_OTHER): Payer: Managed Care, Other (non HMO) | Admitting: Physician Assistant

## 2018-02-17 VITALS — BP 128/82 | HR 61 | Temp 97.7°F | Resp 20 | Ht 61.97 in | Wt 155.0 lb

## 2018-02-17 DIAGNOSIS — Z13228 Encounter for screening for other metabolic disorders: Secondary | ICD-10-CM

## 2018-02-17 DIAGNOSIS — H18833 Recurrent erosion of cornea, bilateral: Secondary | ICD-10-CM | POA: Insufficient documentation

## 2018-02-17 DIAGNOSIS — Z1329 Encounter for screening for other suspected endocrine disorder: Secondary | ICD-10-CM

## 2018-02-17 DIAGNOSIS — E7800 Pure hypercholesterolemia, unspecified: Secondary | ICD-10-CM | POA: Diagnosis not present

## 2018-02-17 DIAGNOSIS — Z0000 Encounter for general adult medical examination without abnormal findings: Secondary | ICD-10-CM

## 2018-02-17 DIAGNOSIS — Z1389 Encounter for screening for other disorder: Secondary | ICD-10-CM

## 2018-02-17 DIAGNOSIS — Z122 Encounter for screening for malignant neoplasm of respiratory organs: Secondary | ICD-10-CM

## 2018-02-17 DIAGNOSIS — Z130 Encounter for screening for diseases of the blood and blood-forming organs and certain disorders involving the immune mechanism: Secondary | ICD-10-CM | POA: Diagnosis not present

## 2018-02-17 DIAGNOSIS — Z131 Encounter for screening for diabetes mellitus: Secondary | ICD-10-CM

## 2018-02-17 LAB — POCT URINALYSIS DIP (MANUAL ENTRY)
BILIRUBIN UA: NEGATIVE
BILIRUBIN UA: NEGATIVE mg/dL
Glucose, UA: NEGATIVE mg/dL
Nitrite, UA: NEGATIVE
PH UA: 7 (ref 5.0–8.0)
PROTEIN UA: NEGATIVE mg/dL
SPEC GRAV UA: 1.015 (ref 1.010–1.025)
Urobilinogen, UA: 0.2 E.U./dL

## 2018-02-17 MED ORDER — ZOSTER VAC RECOMB ADJUVANTED 50 MCG/0.5ML IM SUSR: 0.5000 mL | Freq: Once | INTRAMUSCULAR | 0 refills | Status: AC

## 2018-02-17 NOTE — Progress Notes (Signed)
Briana Meadows  MRN: [id number] DOB: [birth year]  Subjective:  Pt is a 57 y.o. female who presents for annual physical exam. Pt is fasting today.   Primary Preventative Screenings: Cervical Cancer: Pap done at gynecology Dr. Ronita Hipps. No h/o abnml. Underwent total hysterectomy with BTL 03/2014 for postmenopausal bleeding - +fibroids but no cancer. Still has ovaries. Family Planning: s/p hysterectomy STI screening: No concern for STIs at this time, neg Hep C and HIV 2017 Breast Cancer: Mammogram done at gyn Dr. [facility] every November, no hx of abnormal mammograms, no FH of breast cancer  Colorectal Cancer: Colonoscopy in 2017 with Dr. Collene Mares, just 1 polyp noted, rec repeat in 10 years Tobacco use/EtOH/substances: + h/o smoking/vaping tobacco - quit 09/2012. Smoked 1ppd x 35 years. FH of lung cancer in mother.  Bone Density: Takes calcium/vit D supp.  Cardiac: EKG 02/2017 with NSR Weight/Blood sugar/Diet/Exercise:Does not eat breakfast, eats a low carb diet. Drinks water and coffee. No structured exercise. Likes to go backpacking.  OTC/Vit/Supp/Herbal: Takes OTC vitamin B, D, and calcium.  Dentist/Optho: does not see dentist, brushes BID. Sees optho annually. Has hx of recurrent corneal erosions, b/l, L> R.  Immunizations: flu shot done annually at work. Has not had shingrix vaccine. Up to date on tetanus-2014. Last annual physical: 02/2017  Patient Active Problem List   Diagnosis Date Noted  . 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 Pure hypercholesterolemia 02/14/2017  . Vitamin D deficiency 02/11/2016    Current Outpatient Medications on File Prior to Visit  Medication Sig Dispense Refill  . b complex vitamins tablet Take 1 tablet by mouth daily.    . bacitracin-polymyxin b (POLYSPORIN) ophthalmic ointment Place 1 application into the right eye 3 (three) times daily. 3.5 g 0  . CALCIUM PO Take 1,000 mg by mouth.    . cholecalciferol (VITAMIN D) 1000 UNITS  tablet Take 2,000 Units by mouth daily.      No current facility-administered medications on file prior to visit.     Allergies  Allergen Reactions  . Penicillins Other (See Comments)    Childhood rxn --- Hives  . Chlorine Rash    Social History   Socioeconomic History  . Marital status: Single    Spouse name: Not on file  . Number of children: 1  . Years of education: Not on file  . Highest education level: Not on file  Occupational History  . Occupation: social services  Social Needs  . Financial resource strain: Not on file  . Food insecurity:    Worry: Not on file    Inability: Not on file  . Transportation needs:    Medical: Not on file    Non-medical: Not on file  Tobacco Use  . Smoking status: Former Smoker    Packs/day: 1.00    Years: 32.00    Pack years: 32.00    Types: Cigarettes    Last attempt to quit: 07/28/2012    Years since quitting: 5.5  . Smokeless tobacco: Never Used  Substance and Sexual Activity  . Alcohol use: No  . Drug use: No  . Sexual activity: Yes    Birth control/protection: Post-menopausal  Lifestyle  . Physical activity:    Days per week: Not on file    Minutes per session: Not on file  . Stress: Not on file  Relationships  . Social connections:    Talks on phone: Not on file    Gets together: Not on file  Attends religious service: Not on file    Active member of club or organization: Not on file    Attends meetings of clubs or organizations: Not on file    Relationship status: Not on file  Other Topics Concern  . Not on file  Social History Narrative  . Not on file    Past Surgical History:  Procedure Laterality Date  . ABDOMINAL HYSTERECTOMY    . COLONOSCOPY    . DILATION AND CURETTAGE OF UTERUS    . ROBOTIC ASSISTED TOTAL HYSTERECTOMY N/A 03/14/2014   Procedure: ROBOTIC ASSISTED TOTAL HYSTERECTOMY WITH BILATERAL SALPINGECTOMY, removal of right ovarian cyst, uterosacral ligment suspension;  Surgeon: Lovenia Kim, MD;  Location: [facility];  Service: Gynecology;  Laterality: N/A;  . WISDOM TOOTH EXTRACTION      Family History  Problem Relation Age of Onset  . COPD Mother   . Hyperlipidemia Mother   . Cancer Mother   . Non-Hodgkin's lymphoma Sister   . Cancer Maternal Grandmother   . Diabetes Brother     Review of Systems  Constitutional: Negative for activity change, appetite change, chills, diaphoresis, fatigue, fever and unexpected weight change.  HENT: Negative for congestion, dental problem, drooling, ear discharge, ear pain, facial swelling, hearing loss, mouth sores, nosebleeds, postnasal drip, rhinorrhea, sinus pressure, sinus pain, sneezing, sore throat, tinnitus, trouble swallowing and voice change.   Eyes: Negative for photophobia, pain, discharge, redness, itching and visual disturbance.  Respiratory: Negative for apnea, cough, choking, chest tightness, shortness of breath, wheezing and stridor.   Cardiovascular: Negative for chest pain, palpitations and leg swelling.  Gastrointestinal: Negative for abdominal distention, abdominal pain, anal bleeding, blood in stool, constipation, diarrhea, nausea, rectal pain and vomiting.  Endocrine: Negative for cold intolerance, heat intolerance, polydipsia, polyphagia and polyuria.  Genitourinary: Negative for decreased urine volume, difficulty urinating, dyspareunia, dysuria, enuresis, flank pain, frequency, genital sores, hematuria, menstrual problem, pelvic pain, urgency, vaginal bleeding, vaginal discharge and vaginal pain.  Musculoskeletal: Negative for arthralgias, back pain, gait problem, joint swelling, myalgias, neck pain and neck stiffness.  Skin: Negative for color change, pallor, rash and wound.  Allergic/Immunologic: Negative for environmental allergies, food allergies and immunocompromised state.  Neurological: Negative for dizziness, tremors, seizures, syncope, facial asymmetry, speech difficulty, weakness, light-headedness,  numbness and headaches.  Hematological: Negative for adenopathy. Does not bruise/bleed easily.  Psychiatric/Behavioral: Negative for agitation, behavioral problems, confusion, decreased concentration, dysphoric mood, hallucinations, self-injury, sleep disturbance and suicidal ideas. The patient is not nervous/anxious and is not hyperactive.     Objective:  BP 128/82   Pulse 61   Temp 97.7 F (36.5 C) (Oral)   Resp 20   Ht 5' 1.97" (1.574 m)   Wt 155 lb (70.3 kg)   LMP 03/14/2014   SpO2 99%   BMI 28.38 kg/m   Physical Exam  Constitutional: She is oriented to person, place, and time. She appears well-developed and well-nourished. No distress.  HENT:  Head: Normocephalic and atraumatic.  Right Ear: Hearing, tympanic membrane, external ear and ear canal normal.  Left Ear: Hearing, tympanic membrane, external ear and ear canal normal.  Nose: Nose normal.  Mouth/Throat: Uvula is midline, oropharynx is clear and moist and mucous membranes are normal. No oropharyngeal exudate.  Eyes: EOM and lids are normal. Right conjunctiva is injected. Left conjunctiva is injected. No scleral icterus. Pupils are equal.  Wearing sunglasses.   Neck: Trachea normal and normal range of motion. No thyroid mass and no thyromegaly present.  Cardiovascular: Normal rate,  regular rhythm, normal heart sounds and intact distal pulses.  Pulmonary/Chest: Effort normal and breath sounds normal.  Abdominal: Soft. Normal appearance and bowel sounds are normal. There is no tenderness.  Lymphadenopathy:       Head (right side): No tonsillar, no preauricular, no posterior auricular and no occipital adenopathy present.       Head (left side): No tonsillar, no preauricular, no posterior auricular and no occipital adenopathy present.    She has no cervical adenopathy.       Right: No supraclavicular adenopathy present.       Left: No supraclavicular adenopathy present.  Neurological: She is alert and oriented to person,  place, and time. She has normal strength and normal reflexes.  Skin: Skin is warm and dry.    Visual Acuity Screening   Right eye Left eye Both eyes  Without correction: _0  With correction:       Wt Readings from Last 3 Encounters:  02/17/18 155 lb (70.3 kg)  12/28/17 159 lb 3.2 oz (72.2 kg)  02/14/17 159 lb 6.4 oz (72.3 kg)   BP Readings from Last 3 Encounters:  02/17/18 128/82  12/28/17 126/78  02/14/17 120/74   Results for orders placed or performed in visit on 02/17/18 (from the past 24 hour(s))  POCT urinalysis dipstick     Status: Abnormal   Collection Time: 02/17/18 10:26 AM  Result Value Ref Range   Color, UA yellow yellow   Clarity, UA clear clear   Glucose, UA negative negative mg/dL   Bilirubin, UA negative negative   Ketones, POC UA negative negative mg/dL   Spec Grav, UA 1.015 1.010 - 1.025   Blood, UA small (A) negative   pH, UA 7.0 5.0 - 8.0   Protein Ur, POC negative negative mg/dL   Urobilinogen, UA 0.2 0.2 or 1.0 E.U./dL   Nitrite, UA Negative Negative   Leukocytes, UA Trace (A) Negative     Assessment and Plan :  Discussed healthy lifestyle, diet, exercise, preventative care, vaccinations, and addressed patient's concerns. Plan for follow up in one year. Otherwise, plan for specific conditions below.  1. Annual physical exam Await lab results. Work from completed, scanned, and returned to pt.   2. Screening for deficiency anemia - CBC with Differential/Platelet  3. Screening for thyroid disorder - TSH  4. Pure hypercholesterolemia - Lipid panel  5. Screening for metabolic disord - GGY69+SWNI  6. Screening for hematuria or proteinuria UA dip +blood, urine micro pending. Pt asx.  - POCT urinalysis dipstick  7. Screening for diabetes mellitus - Hemoglobin A1c  8. Recurrent corneal erosion, bilateral  9. Encounter for screening for lung cancer - CT CHEST LUNG CA SCREEN LOW DOSE W/O CM; Future  Meds ordered this  encounter  Medications  . Zoster Vaccine Adjuvanted [facility]) injection    Sig: Inject 0.5 mLs into the muscle once for 1 dose.    Dispense:  0.5 mL    Refill:  0    Order Specific Question:   Supervising Provider    Answer:   Horald Pollen [[id number]]     Briana Delaine, PA-C  [facility] 02/17/2018 11:02 AM

## 2018-02-17 NOTE — Patient Instructions (Addendum)
If you have lab work done today you will be contacted with your lab results within the next 2 weeks.  If you have not heard from [place] then please contact us. The fastest way to get your results is to register for My Chart. Health Maintenance, Female Adopting a healthy lifestyle and getting preventive care can go a long way to promote health and wellness. Talk with your health care provider about what schedule of regular examinations is right for you. This is a good chance for you to check in with your provider about disease prevention and staying healthy. In between checkups, there are plenty of things you can do on your own. Experts have done a lot of research about which lifestyle changes and preventive measures are most likely to keep you healthy. Ask your health care provider for more information. Weight and diet Eat a healthy diet  Be sure to include plenty of vegetables, fruits, low-fat dairy products, and lean protein.  Do not eat a lot of foods high in solid fats, added sugars, or salt.  Get regular exercise. This is one of the most important things you can do for your health. ? Most adults should exercise for at least 150 minutes each week. The exercise should increase your heart rate and make you sweat (moderate-intensity exercise). ? Most adults should also do strengthening exercises at least twice a week. This is in addition to the moderate-intensity exercise.  Maintain a healthy weight  Body mass index (BMI) is a measurement that can be used to identify possible weight problems. It estimates body fat based on height and weight. Your health care provider can help determine your BMI and help you achieve or maintain a healthy weight.  For females 15 years of age and older: ? A BMI below 18.5 is considered underweight. ? A BMI of 18.5 to 24.9 is normal. ? A BMI of 25 to 29.9 is considered overweight. ? A BMI of 30 and above is considered obese.  Watch levels of cholesterol and  blood lipids  You should start having your blood tested for lipids and cholesterol at 55 years of age, then have this test every 5 years.  You may need to have your cholesterol levels checked more often if: ? Your lipid or cholesterol levels are high. ? You are older than 55 years of age. ? You are at high risk for heart disease.  Cancer screening Lung Cancer  Lung cancer screening is recommended for adults 59-39 years old who are at high risk for lung cancer because of a history of smoking.  A yearly low-dose CT scan of the lungs is recommended for people who: ? Currently smoke. ? Have quit within the past 15 years. ? Have at least a 30-pack-year history of smoking. A pack year is smoking an average of one pack of cigarettes a day for 1 year.  Yearly screening should continue until it has been 15 years since you quit.  Yearly screening should stop if you develop a health problem that would prevent you from having lung cancer treatment.  Breast Cancer  Practice breast self-awareness. This means understanding how your breasts normally appear and feel.  It also means doing regular breast self-exams. Let your health care provider know about any changes, no matter how small.  If you are in your 20s or 30s, you should have a clinical breast exam (CBE) by a health care provider every 1-3 years as part of a regular health  exam.  If you are 40 or older, have a CBE every year. Also consider having a breast X-ray (mammogram) every year.  If you have a family history of breast cancer, talk to your health care provider about genetic screening.  If you are at high risk for breast cancer, talk to your health care provider about having an MRI and a mammogram every year.  Breast cancer gene (BRCA) assessment is recommended for women who have family members with BRCA-related cancers. BRCA-related cancers include: ? Breast. ? Ovarian. ? Tubal. ? Peritoneal cancers.  Results of the assessment  will determine the need for genetic counseling and BRCA1 and BRCA2 testing.  Cervical Cancer Your health care provider may recommend that you be screened regularly for cancer of the pelvic organs (ovaries, uterus, and vagina). This screening involves a pelvic examination, including checking for microscopic changes to the surface of your cervix (Pap test). You may be encouraged to have this screening done every 3 years, beginning at age 31.  For women ages 82-65, health care providers may recommend pelvic exams and Pap testing every 3 years, or they may recommend the Pap and pelvic exam, combined with testing for human papilloma virus (HPV), every 5 years. Some types of HPV increase your risk of cervical cancer. Testing for HPV may also be done on women of any age with unclear Pap test results.  Other health care providers may not recommend any screening for nonpregnant women who are considered low risk for pelvic cancer and who do not have symptoms. Ask your health care provider if a screening pelvic exam is right for you.  If you have had past treatment for cervical cancer or a condition that could lead to cancer, you need Pap tests and screening for cancer for at least 20 years after your treatment. If Pap tests have been discontinued, your risk factors (such as having a new sexual partner) need to be reassessed to determine if screening should resume. Some women have medical problems that increase the chance of getting cervical cancer. In these cases, your health care provider may recommend more frequent screening and Pap tests.  Colorectal Cancer  This type of cancer can be detected and often prevented.  Routine colorectal cancer screening usually begins at 55 years of age and continues through 55 years of age.  Your health care provider may recommend screening at an earlier age if you have risk factors for colon cancer.  Your health care provider may also recommend using home test kits to  check for hidden blood in the stool.  A small camera at the end of a tube can be used to examine your colon directly (sigmoidoscopy or colonoscopy). This is done to check for the earliest forms of colorectal cancer.  Routine screening usually begins at age 53.  Direct examination of the colon should be repeated every 5-10 years through 55 years of age. However, you may need to be screened more often if early forms of precancerous polyps or small growths are found.  Skin Cancer  Check your skin from head to toe regularly.  Tell your health care provider about any new moles or changes in moles, especially if there is a change in a mole's shape or color.  Also tell your health care provider if you have a mole that is larger than the size of a pencil eraser.  Always use sunscreen. Apply sunscreen liberally and repeatedly throughout the day.  Protect yourself by wearing long sleeves, pants, a wide-brimmed  hat, and sunglasses whenever you are outside.  Heart disease, diabetes, and high blood pressure  High blood pressure causes heart disease and increases the risk of stroke. High blood pressure is more likely to develop in: ? People who have blood pressure in the high end of the normal range (130-139/85-89 mm Hg). ? People who are overweight or obese. ? People who are African American.  If you are 18-39 years of age, have your blood pressure checked every 3-5 years. If you are 40 years of age or older, have your blood pressure checked every year. You should have your blood pressure measured twice-once when you are at a hospital or clinic, and once when you are not at a hospital or clinic. Record the average of the two measurements. To check your blood pressure when you are not at a hospital or clinic, you can use: ? An automated blood pressure machine at a pharmacy. ? A home blood pressure monitor.  If you are between 55 years and 79 years old, ask your health care provider if you should  take aspirin to prevent strokes.  Have regular diabetes screenings. This involves taking a blood sample to check your fasting blood sugar level. ? If you are at a normal weight and have a low risk for diabetes, have this test once every three years after 55 years of age. ? If you are overweight and have a high risk for diabetes, consider being tested at a younger age or more often. Preventing infection Hepatitis B  If you have a higher risk for hepatitis B, you should be screened for this virus. You are considered at high risk for hepatitis B if: ? You were born in a country where hepatitis B is common. Ask your health care provider which countries are considered high risk. ? Your parents were born in a high-risk country, and you have not been immunized against hepatitis B (hepatitis B vaccine). ? You have HIV or AIDS. ? You use needles to inject street drugs. ? You live with someone who has hepatitis B. ? You have had sex with someone who has hepatitis B. ? You get hemodialysis treatment. ? You take certain medicines for conditions, including cancer, organ transplantation, and autoimmune conditions.  Hepatitis C  Blood testing is recommended for: ? Everyone born from 1945 through 1965. ? Anyone with known risk factors for hepatitis C.  Sexually transmitted infections (STIs)  You should be screened for sexually transmitted infections (STIs) including gonorrhea and chlamydia if: ? You are sexually active and are younger than 55 years of age. ? You are older than 55 years of age and your health care provider tells you that you are at risk for this type of infection. ? Your sexual activity has changed since you were last screened and you are at an increased risk for chlamydia or gonorrhea. Ask your health care provider if you are at risk.  If you do not have HIV, but are at risk, it may be recommended that you take a prescription medicine daily to prevent HIV infection. This is called  pre-exposure prophylaxis (PrEP). You are considered at risk if: ? You are sexually active and do not regularly use condoms or know the HIV status of your partner(s). ? You take drugs by injection. ? You are sexually active with a partner who has HIV.  Talk with your health care provider about whether you are at high risk of being infected with HIV. If you choose to begin   PrEP, you should first be tested for HIV. You should then be tested every 3 months for as long as you are taking PrEP. Pregnancy  If you are premenopausal and you may become pregnant, ask your health care provider about preconception counseling.  If you may become pregnant, take 400 to 800 micrograms (mcg) of folic acid every day.  If you want to prevent pregnancy, talk to your health care provider about birth control (contraception). Osteoporosis and menopause  Osteoporosis is a disease in which the bones lose minerals and strength with aging. This can result in serious bone fractures. Your risk for osteoporosis can be identified using a bone density scan.  If you are 62 years of age or older, or if you are at risk for osteoporosis and fractures, ask your health care provider if you should be screened.  Ask your health care provider whether you should take a calcium or vitamin D supplement to lower your risk for osteoporosis.  Menopause may have certain physical symptoms and risks.  Hormone replacement therapy may reduce some of these symptoms and risks. Talk to your health care provider about whether hormone replacement therapy is right for you. Follow these instructions at home:  Schedule regular health, dental, and eye exams.  Stay current with your immunizations.  Do not use any tobacco products including cigarettes, chewing tobacco, or electronic cigarettes.  If you are pregnant, do not drink alcohol.  If you are breastfeeding, limit how much and how often you drink alcohol.  Limit alcohol intake to no more  than 1 drink per day for nonpregnant women. One drink equals 12 ounces of beer, 5 ounces of wine, or 1 ounces of hard liquor.  Do not use street drugs.  Do not share needles.  Ask your health care provider for help if you need support or information about quitting drugs.  Tell your health care provider if you often feel depressed.  Tell your health care provider if you have ever been abused or do not feel safe at home. This information is not intended to replace advice given to you by your health care provider. Make sure you discuss any questions you have with your health care provider. Document Released: 11/09/2010 Document Revised: 10/02/2015 Document Reviewed: 01/28/2015 Elsevier Interactive Patient Education  2018 [employer].   IF you received an x-ray today, you will receive an invoice from [facility]. Please contact [facility] at [phone] with questions or concerns regarding your invoice.   IF you received labwork today, you will receive an invoice from [facility]. Please contact [facility] at [phone] with questions or concerns regarding your invoice.   Our billing staff will not be able to assist you with questions regarding bills from these companies.  You will be contacted with the lab results as soon as they are available. The fastest way to get your results is to activate your My Chart account. Instructions are located on the last page of this paperwork. If you have not heard from [place] regarding the results in 2 weeks, please contact this office.

## 2018-02-18 LAB — LIPID PANEL
Chol/HDL Ratio: 3.3 ratio (ref 0.0–4.4)
Cholesterol, Total: 282 mg/dL — ABNORMAL HIGH (ref 100–199)
HDL: 86 mg/dL (ref >39)
LDL CALC: 175 mg/dL — AB (ref 0–99)
Triglycerides: 104 mg/dL (ref 0–149)
VLDL CHOLESTEROL CAL: 21 mg/dL (ref 5–40)

## 2018-02-18 LAB — URINALYSIS, MICROSCOPIC ONLY
Bacteria, UA: NONE SEEN
Casts: NONE SEEN /LPF
RBC, UA: NONE SEEN /HPF (ref 0–2)

## 2018-02-18 LAB — CBC WITH DIFFERENTIAL/PLATELET
BASOS ABS: 0.1 10*3/uL (ref 0.0–0.2)
Basos: 1 %
EOS (ABSOLUTE): 0.1 10*3/uL (ref 0.0–0.4)
Eos: 1 %
HEMATOCRIT: 45.1 % (ref 34.0–46.6)
Hemoglobin: 14.8 g/dL (ref 11.1–15.9)
Immature Grans (Abs): 0 10*3/uL (ref 0.0–0.1)
Immature Granulocytes: 0 %
LYMPHS ABS: 2.5 10*3/uL (ref 0.7–3.1)
Lymphs: 41 %
MCH: 28.8 pg (ref 26.6–33.0)
MCHC: 32.8 g/dL (ref 31.5–35.7)
MCV: 88 fL (ref 79–97)
MONOCYTES: 7 %
Monocytes Absolute: 0.4 10*3/uL (ref 0.1–0.9)
Neutrophils Absolute: 3.1 10*3/uL (ref 1.4–7.0)
Neutrophils: 50 %
Platelets: 288 10*3/uL (ref 150–450)
RBC: 5.14 x10E6/uL (ref 3.77–5.28)
RDW: 12.7 % (ref 12.3–15.4)
WBC: 6.2 10*3/uL (ref 3.4–10.8)

## 2018-02-18 LAB — CMP14+EGFR
ALBUMIN: 4.9 g/dL (ref 3.5–5.5)
ALT: 11 IU/L (ref 0–32)
AST: 15 IU/L (ref 0–40)
Albumin/Globulin Ratio: 2.1 (ref 1.2–2.2)
Alkaline Phosphatase: 75 IU/L (ref 39–117)
BUN / CREAT RATIO: 16 (ref 9–23)
BUN: 12 mg/dL (ref 6–24)
Bilirubin Total: 0.4 mg/dL (ref 0.0–1.2)
CALCIUM: 9.6 mg/dL (ref 8.7–10.2)
CO2: 22 mmol/L (ref 20–29)
CREATININE: 0.74 mg/dL (ref 0.57–1.00)
Chloride: 102 mmol/L (ref 96–106)
GFR calc non Af Amer: 91 mL/min/{1.73_m2} (ref >59)
GFR, EST AFRICAN AMERICAN: 105 mL/min/{1.73_m2} (ref >59)
GLOBULIN, TOTAL: 2.3 g/dL (ref 1.5–4.5)
GLUCOSE: 96 mg/dL (ref 65–99)
Potassium: 5.1 mmol/L (ref 3.5–5.2)
SODIUM: 140 mmol/L (ref 134–144)
TOTAL PROTEIN: 7.2 g/dL (ref 6.0–8.5)

## 2018-02-18 LAB — HEMOGLOBIN A1C
Est. average glucose Bld gHb Est-mCnc: 114 mg/dL
HEMOGLOBIN A1C: 5.6 % (ref 4.8–5.6)

## 2018-02-18 LAB — TSH: TSH: 1.66 u[IU]/mL (ref 0.450–4.500)

## 2018-02-20 ENCOUNTER — Encounter: Payer: Managed Care, Other (non HMO) | Admitting: Family Medicine

## 2018-02-23 ENCOUNTER — Encounter: Payer: Managed Care, Other (non HMO) | Admitting: Family Medicine

## 2018-02-27 ENCOUNTER — Encounter: Payer: Managed Care, Other (non HMO) | Admitting: Family Medicine

## 2018-02-28 ENCOUNTER — Ambulatory Visit
Admission: RE | Admit: 2018-02-28 | Discharge: 2018-02-28 | Disposition: A | Discharge status: Home / Self Care | Payer: Managed Care, Other (non HMO) | Source: Ambulatory Visit | Attending: Physician Assistant | Admitting: Physician Assistant

## 2018-02-28 DIAGNOSIS — Z122 Encounter for screening for malignant neoplasm of respiratory organs: Secondary | ICD-10-CM

## 2019-02-20 ENCOUNTER — Ambulatory Visit (INDEPENDENT_AMBULATORY_CARE_PROVIDER_SITE_OTHER): Payer: Managed Care, Other (non HMO) | Admitting: Family Medicine

## 2019-02-20 ENCOUNTER — Other Ambulatory Visit: Payer: Self-pay

## 2019-02-20 ENCOUNTER — Encounter: Payer: Self-pay | Admitting: Family Medicine

## 2019-02-20 VITALS — BP 121/77 | HR 60 | Temp 98.6°F | Wt 157.8 lb

## 2019-02-20 DIAGNOSIS — Z23 Encounter for immunization: Secondary | ICD-10-CM

## 2019-02-20 DIAGNOSIS — Z131 Encounter for screening for diabetes mellitus: Secondary | ICD-10-CM

## 2019-02-20 DIAGNOSIS — E7800 Pure hypercholesterolemia, unspecified: Secondary | ICD-10-CM | POA: Diagnosis not present

## 2019-02-20 DIAGNOSIS — Z0001 Encounter for general adult medical examination with abnormal findings: Secondary | ICD-10-CM | POA: Diagnosis not present

## 2019-02-20 DIAGNOSIS — I700 Atherosclerosis of aorta: Secondary | ICD-10-CM | POA: Diagnosis not present

## 2019-02-20 DIAGNOSIS — Z122 Encounter for screening for malignant neoplasm of respiratory organs: Secondary | ICD-10-CM

## 2019-02-20 DIAGNOSIS — Z0000 Encounter for general adult medical examination without abnormal findings: Secondary | ICD-10-CM

## 2019-02-20 NOTE — Progress Notes (Signed)
Chief Complaint  Patient presents with  . Establish Care    here today to est care and annual exam. Physical form in door    Subjective:  Briana Meadows is a 58 y.o. female here for a health maintenance visit.  Patient is established pt  Patient Active Problem List   Diagnosis Date Noted  . 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 Pure hypercholesterolemia 02/14/2017  . Vitamin D deficiency 02/11/2016    Past Medical History:  Diagnosis Date  . Blood transfusion complicating pregnancy [id number]   [facility] in [place], unsure number of units transfused  . Heart murmur    as a child - never any problems  . SVD (spontaneous vaginal delivery)    x 2  . Vitamin D deficiency     Past Surgical History:  Procedure Laterality Date  . ABDOMINAL HYSTERECTOMY    . COLONOSCOPY    . DILATION AND CURETTAGE OF UTERUS    . ROBOTIC ASSISTED TOTAL HYSTERECTOMY N/A 03/14/2014   Procedure: ROBOTIC ASSISTED TOTAL HYSTERECTOMY WITH BILATERAL SALPINGECTOMY, removal of right ovarian cyst, uterosacral ligment suspension;  Surgeon: Lovenia Kim, MD;  Location: [facility];  Service: Gynecology;  Laterality: N/A;  . WISDOM TOOTH EXTRACTION       Outpatient Medications Prior to Visit  Medication Sig Dispense Refill  . b complex vitamins tablet Take 1 tablet by mouth daily.    . bacitracin-polymyxin b (POLYSPORIN) ophthalmic ointment Place 1 application into the right eye 3 (three) times daily. 3.5 g 0  . CALCIUM PO Take 1,000 mg by mouth.    . cholecalciferol (VITAMIN D) 1000 UNITS tablet Take 2,000 Units by mouth daily.      No facility-administered medications prior to visit.     Allergies  Allergen Reactions  . Penicillins Other (See Comments)    Childhood rxn --- Hives  . Chlorine Rash     Family History  Problem Relation Age of Onset  . COPD Mother   . Hyperlipidemia Mother   . Cancer Mother   . Non-Hodgkin's lymphoma Sister   . Cancer Maternal Grandmother   .  Diabetes Brother      Health Habits: Dental Exam: up to date Eye Exam: up to date Exercise: 7 times/week on average Current exercise activities: walking and yard work Diet: balanced, less red meat  Social History   Socioeconomic History  . Marital status: Single    Spouse name: Not on file  . Number of children: 1  . Years of education: Not on file  . Highest education level: Not on file  Occupational History  . Occupation: social services  Social Needs  . Financial resource strain: Not on file  . Food insecurity    Worry: Not on file    Inability: Not on file  . Transportation needs    Medical: Not on file    Non-medical: Not on file  Tobacco Use  . Smoking status: Former Smoker    Packs/day: 1.00    Years: 35.00    Pack years: 35.00    Types: Cigarettes    Quit date: 07/28/2012    Years since quitting: 6.5  . Smokeless tobacco: Former Network engineer and Sexual Activity  . Alcohol use: No  . Drug use: No  . Sexual activity: Yes    Birth control/protection: Post-menopausal  Lifestyle  . Physical activity    Days per week: Not on file    Minutes per session: Not on file  . Stress:  Not on file  Relationships  . Social Herbalist on phone: Not on file    Gets together: Not on file    Attends religious service: Not on file    Active member of club or organization: Not on file    Attends meetings of clubs or organizations: Not on file    Relationship status: Not on file  . Intimate partner violence    Fear of current or ex partner: Not on file    Emotionally abused: Not on file    Physically abused: Not on file    Forced sexual activity: Not on file  Other Topics Concern  . Not on file  Social History Narrative  . Not on file   Social History   Substance and Sexual Activity  Alcohol Use No   Social History   Tobacco Use  Smoking Status Former Smoker  . Packs/day: 1.00  . Years: 35.00  . Pack years: 35.00  . Types: Cigarettes  . Quit  date: 07/28/2012  . Years since quitting: 6.5  Smokeless Tobacco Former Systems developer   Social History   Substance and Sexual Activity  Drug Use No    GYN: Sexual Health Menstrual status: regular menses LMP: Patient's last menstrual period was 03/14/2014. s/p hysterectomy Last pap smear: see HM section History of abnormal pap smears:  Sexually active: with female partner Current contraception: hysterectomy  Health Maintenance: See under health Maintenance activity for review of completion dates as well. Immunization History  Administered Date(s) Administered  . Influenza,inj,Quad PF,6+ Mos 03/08/2014, 02/20/2019  . Influenza-Unspecified 02/04/2016, 02/10/2017  . Tdap 09/01/2012  . Zoster Recombinat (Shingrix) 02/17/2018      Depression Screen-PHQ2/9 Depression screen [facility] 2/9 02/20/2019 02/17/2018 12/28/2017 02/14/2017 07/09/2016  Decreased Interest 0 0 0 0 0  Down, Depressed, Hopeless 0 0 0 0 0  PHQ - 2 Score 0 0 0 0 0       Depression Severity and Treatment Recommendations:  0-4= None  5-9= Mild / Treatment: Support, educate to call if worse; return in one month  10-14= Moderate / Treatment: Support, watchful waiting; Antidepressant or Psycotherapy  15-19= Moderately severe / Treatment: Antidepressant OR Psychotherapy  >= 20 = Major depression, severe / Antidepressant AND Psychotherapy    Review of Systems   ROS  See HPI for ROS as well.   Review of Systems  Constitutional: Negative for activity change, appetite change, chills and fever.  HENT: Negative for congestion, nosebleeds, trouble swallowing and voice change.   Respiratory: Negative for cough, shortness of breath and wheezing.   Gastrointestinal: Negative for diarrhea, nausea and vomiting.  Genitourinary: Negative for difficulty urinating, dysuria, flank pain and hematuria.  Musculoskeletal: Negative for back pain, joint swelling and neck pain.  Neurological: Negative for dizziness, speech difficulty,  light-headedness and numbness.  See HPI. All other review of systems negative.    Objective:   Vitals:   02/20/19 0951  BP: 121/77  Pulse: 60  Temp: 98.6 F (37 C)  TempSrc: Oral  SpO2: 98%  Weight: 157 lb 12.8 oz (71.6 kg)    Body mass index is 28.89 kg/m.  Physical Exam  BP 121/77 (BP Location: Right Arm, Patient Position: Sitting, Cuff Size: Normal)   Pulse 60   Temp 98.6 F (37 C) (Oral)   Wt 157 lb 12.8 oz (71.6 kg)   LMP 03/14/2014   SpO2 98%   BMI 28.89 kg/m   General Appearance:    Alert, cooperative, no distress, appears  stated age  Head:    Normocephalic, without obvious abnormality, atraumatic  Eyes:    PERRL, conjunctiva/corneas clear, EOM's intact  Ears:    Normal TM's and external ear canals, both ears  Nose:   Nares normal, septum midline, mucosa normal, no drainage    or sinus tenderness  Throat:   Lips, mucosa, and tongue normal; teeth and gums normal  Neck:   Supple, symmetrical, trachea midline, no adenopathy;    thyroid:  no enlargement/tenderness/nodules; no carotid   bruit or JVD  Back:     Symmetric, no curvature, ROM normal, no CVA tenderness  Lungs:     Clear to auscultation bilaterally, respirations unlabored  Chest Wall:    No tenderness or deformity   Heart:    Regular rate and rhythm, S1 and S2 normal, no murmur, rub   or gallop  Abdomen:     Soft, non-tender, bowel sounds active all four quadrants,    no masses, no organomegaly  Extremities:   Extremities normal, atraumatic, no cyanosis or edema  Pulses:   2+ and symmetric all extremities  Skin:   Skin color, texture, turgor normal, no rashes or lesions  Lymph nodes:   Cervical, supraclavicular, and axillary nodes normal  Neurologic:   CNII-XII intact, normal strength, sensation and reflexes    throughout      Assessment/Plan:   Patient was seen for a health maintenance exam.  Counseled the patient on health maintenance issues. Reviewed her health mainteance schedule and  ordered appropriate tests (see orders.) Counseled on regular exercise and weight management. Recommend regular eye exams and dental cleaning.   The following issues were addressed today for health maintenance:   Briana Meadows was seen today for establish care.  Diagnoses and all orders for this visit:  Routine general medical examination at a health care facility - Women's Health Maintenance Plan Advised monthly breast exam and annual mammogram Advised dental exam every six months Discussed stress management Discussed pap smear screening guidelines  Need for prophylactic vaccination and inoculation against influenza -     Flu Vaccine QUAD 6+ mos PF IM (Fluarix Quad PF)  Pure hypercholesterolemia -     Lipid Panel -     Comprehensive metabolic panel -     Hemoglobin A1c  Aortic atherosclerosis (HCC) -     Lipid Panel -     Comprehensive metabolic panel -     Hemoglobin A1c  Encounter for screening for malignant neoplasm of respiratory organs -     CT CHEST LUNG CA SCREEN LOW DOSE W/O CM; Future  Screening for diabetes mellitus -     Hemoglobin A1c  Encounter for screening for lung cancer -     CT CHEST LUNG CA SCREEN LOW DOSE W/O CM; Future    No follow-ups on file.    Body mass index is 28.89 kg/m.:  Discussed the patient's BMI with patient. The BMI body mass index is 28.89 kg/m.     No future appointments.  Patient Instructions       If you have lab work done today you will be contacted with your lab results within the next 2 weeks.  If you have not heard from [place] then please contact us. The fastest way to get your results is to register for My Chart.   IF you received an x-ray today, you will receive an invoice from [facility]. Please contact [facility] at [phone] with questions or concerns regarding your invoice.   IF you  received labwork today, you will receive an invoice from [facility]. Please contact [facility] at [phone] with  questions or concerns regarding your invoice.   Our billing staff will not be able to assist you with questions regarding bills from these companies.  You will be contacted with the lab results as soon as they are available. The fastest way to get your results is to activate your My Chart account. Instructions are located on the last page of this paperwork. If you have not heard from [place] regarding the results in 2 weeks, please contact this office.

## 2019-02-20 NOTE — Patient Instructions (Signed)
° ° ° °  If you have lab work done today you will be contacted with your lab results within the next 2 weeks.  If you have not heard from us then please contact us. The fastest way to get your results is to register for My Chart. ° ° °IF you received an x-ray today, you will receive an invoice from [facility]. Please contact [facility] at [phone] with questions or concerns regarding your invoice.  ° °IF you received labwork today, you will receive an invoice from [facility]. Please contact [facility] at [phone] with questions or concerns regarding your invoice.  ° °Our billing staff will not be able to assist you with questions regarding bills from these companies. ° °You will be contacted with the lab results as soon as they are available. The fastest way to get your results is to activate your My Chart account. Instructions are located on the last page of this paperwork. If you have not heard from us regarding the results in 2 weeks, please contact this office. °  ° ° ° °

## 2019-02-21 LAB — HEMOGLOBIN A1C
Est. average glucose Bld gHb Est-mCnc: 123 mg/dL
Hgb A1c MFr Bld: 5.9 % — ABNORMAL HIGH (ref 4.8–5.6)

## 2019-02-21 LAB — COMPREHENSIVE METABOLIC PANEL
ALT: 13 IU/L (ref 0–32)
AST: 17 IU/L (ref 0–40)
Albumin/Globulin Ratio: 2.3 — ABNORMAL HIGH (ref 1.2–2.2)
Albumin: 4.8 g/dL (ref 3.8–4.9)
Alkaline Phosphatase: 85 IU/L (ref 39–117)
BUN/Creatinine Ratio: 16 (ref 9–23)
BUN: 11 mg/dL (ref 6–24)
Bilirubin Total: 0.3 mg/dL (ref 0.0–1.2)
CO2: 21 mmol/L (ref 20–29)
Calcium: 9.6 mg/dL (ref 8.7–10.2)
Chloride: 112 mmol/L — ABNORMAL HIGH (ref 96–106)
Creatinine, Ser: 0.67 mg/dL (ref 0.57–1.00)
GFR calc Af Amer: 114 mL/min/{1.73_m2} (ref >59)
GFR calc non Af Amer: 99 mL/min/{1.73_m2} (ref >59)
Globulin, Total: 2.1 g/dL (ref 1.5–4.5)
Glucose: 98 mg/dL (ref 65–99)
Potassium: 4.7 mmol/L (ref 3.5–5.2)
Sodium: 140 mmol/L (ref 134–144)
Total Protein: 6.9 g/dL (ref 6.0–8.5)

## 2019-02-21 LAB — LIPID PANEL
Chol/HDL Ratio: 2.9 ratio (ref 0.0–4.4)
Cholesterol, Total: 257 mg/dL — ABNORMAL HIGH (ref 100–199)
HDL: 90 mg/dL (ref >39)
LDL Chol Calc (NIH): 153 mg/dL — ABNORMAL HIGH (ref 0–99)
Triglycerides: 86 mg/dL (ref 0–149)
VLDL Cholesterol Cal: 14 mg/dL (ref 5–40)

## 2019-03-26 ENCOUNTER — Telehealth: Payer: Self-pay | Admitting: Acute Care

## 2019-03-26 NOTE — Telephone Encounter (Signed)
Noted. Will close this message and refer to new referral placed.

## 2019-03-26 NOTE — Addendum Note (Signed)
Addended by: Delia Chimes A on: 03/26/2019 02:00 PM   Modules accepted: Orders

## 2019-05-28 ENCOUNTER — Other Ambulatory Visit: Payer: Self-pay | Admitting: *Deleted

## 2019-05-28 DIAGNOSIS — Z87891 Personal history of nicotine dependence: Secondary | ICD-10-CM

## 2019-06-18 ENCOUNTER — Ambulatory Visit (INDEPENDENT_AMBULATORY_CARE_PROVIDER_SITE_OTHER): Payer: Managed Care, Other (non HMO) | Admitting: Acute Care

## 2019-06-18 ENCOUNTER — Other Ambulatory Visit: Payer: Self-pay

## 2019-06-18 ENCOUNTER — Encounter: Payer: Self-pay | Admitting: Acute Care

## 2019-06-18 ENCOUNTER — Ambulatory Visit (INDEPENDENT_AMBULATORY_CARE_PROVIDER_SITE_OTHER)
Admission: RE | Admit: 2019-06-18 | Discharge: 2019-06-18 | Disposition: A | Discharge status: Home / Self Care | Payer: Managed Care, Other (non HMO) | Source: Ambulatory Visit | Attending: Acute Care | Admitting: Acute Care

## 2019-06-18 VITALS — BP 126/78 | HR 73 | Temp 97.8°F | Ht 61.0 in | Wt 164.2 lb

## 2019-06-18 DIAGNOSIS — Z87891 Personal history of nicotine dependence: Secondary | ICD-10-CM

## 2019-06-18 NOTE — Progress Notes (Signed)
Shared Decision Making Visit Lung Cancer Screening Program [phone])   Eligibility:  Age 59 y.o.  Pack Years Smoking History Calculation 35 pack year smoking history (# packs/per year x # years smoked)  Recent History of coughing up blood  no  Unexplained weight loss? no ( >Than 15 pounds within the last 6 months )  Prior History Lung / other cancer no (Diagnosis within the last 5 years already requiring surveillance chest CT Scans).  Smoking Status Former Smoker  Former Smokers: Years since quit: 7 years  Quit Date: 07/28/2012  Visit Components:  Discussion included one or more decision making aids. yes  Discussion included risk/benefits of screening. yes  Discussion included potential follow up diagnostic testing for abnormal scans. yes  Discussion included meaning and risk of over diagnosis. yes  Discussion included meaning and risk of False Positives. yes  Discussion included meaning of total radiation exposure. yes  Counseling Included:  Importance of adherence to annual lung cancer LDCT screening. yes  Impact of comorbidities on ability to participate in the program. yes  Ability and willingness to under diagnostic treatment. yes  Smoking Cessation Counseling:  Current Smokers:   Discussed importance of smoking cessation. yes  Information about tobacco cessation classes and interventions provided to patient. yes  Patient provided with "ticket" for LDCT Scan. yes  Symptomatic Patient. no  Counseling  Diagnosis Code: Tobacco Use Z72.0  Asymptomatic Patient yes  Counseling (Intermediate counseling: > three minutes counseling) [id number]  Former Smokers:   Discussed the importance of maintaining cigarette abstinence. yes  Diagnosis Code: Personal History of Nicotine Dependence. [id number]  Information about tobacco cessation classes and interventions provided to patient. Yes  Patient provided with "ticket" for LDCT Scan. yes  Written Order for Lung  Cancer Screening with LDCT placed in Epic. Yes (CT Chest Lung Cancer Screening Low Dose W/O CM) [id number] Z12.2-Screening of respiratory organs Z87.891-Personal history of nicotine dependence  BP 126/78 (BP Location: Left Arm, Cuff Size: Normal)   Pulse 73   Temp 97.8 F (36.6 C) (Oral)   Ht 5\' 1"  (1.549 m)   Wt 164 lb 3.2 oz (74.5 kg)   LMP 03/14/2014   SpO2 98%   BMI 31.03 kg/m    I spent 25 minutes of face to face time with Briana Meadows discussing the risks and benefits of lung cancer screening. We viewed a power point together that explained in detail the above noted topics. We took the time to pause the power point at intervals to allow for questions to be asked and answered to ensure understanding. We discussed that she had taken the single most powerful action possible to decrease her risk of developing lung cancer when she quit smoking. I counseled her to remain smoke free, and to contact me if she ever had the desire to smoke again so that I can provide resources and tools to help support the effort to remain smoke free. We discussed the time and location of the scan, and that either  Doroteo Glassman RN or I will call with the results within  24-48 hours of receiving them. She has my card and contact information in the event she needs to speak with me, in addition to a copy of the power point we reviewed as a resource. She verbalized understanding of all of the above and had no further questions upon leaving the office.     I explained to the patient that there has been a high incidence of coronary artery disease noted on  these exams. I explained that this is a non-gated exam therefore degree or severity cannot be determined. This patient is not on statin therapy. I have asked the patient to follow-up with their PCP regarding any incidental finding of coronary artery disease and management with diet or medication as they feel is clinically indicated. The patient verbalized understanding of  the above and had no further questions.     Magdalen Spatz, NP 06/18/2019 4:04 PM

## 2019-06-18 NOTE — Patient Instructions (Signed)
Thank you for participating in the [facility]. It was our pleasure to meet you today. We will call you with the results of your scan within the next few days. Your scan will be assigned a Lung RADS category score by the physicians reading the scans.  This Lung RADS score determines follow up scanning.  See below for description of categories, and follow up screening recommendations. We will be in touch to schedule your follow up screening annually or based on recommendations of our providers. We will fax a copy of your scan results to your Primary Care Physician, or the physician who referred you to the program, to ensure they have the results. Please call the office if you have any questions or concerns regarding your scanning experience or results.  Our office number is [phone]. Please speak with Denise Phelps, RN. She is our Lung Cancer Screening RN. If she is unavailable when you call, please have the office staff send her a message. She will return your call at her earliest convenience. Remember, if your scan is normal, we will scan you annually as long as you continue to meet the criteria for the program. (Age 55-77, Current smoker or smoker who has quit within the last 15 years). If you are a smoker, remember, quitting is the single most powerful action that you can take to decrease your risk of lung cancer and other pulmonary, breathing related problems. We know quitting is hard, and we are here to help.  Please let us know if there is anything we can do to help you meet your goal of quitting. If you are a former smoker, congratulations. We are proud of you! Remain smoke free! Remember you can refer friends or family members through the number above.  We will screen them to make sure they meet criteria for the program. Thank you for helping us take better care of you by participating in Lung Screening.  Lung RADS Categories:  Lung RADS 1: no nodules  or definitely non-concerning nodules.  Recommendation is for a repeat annual scan in 12 months.  Lung RADS 2:  nodules that are non-concerning in appearance and behavior with a very low likelihood of becoming an active cancer. Recommendation is for a repeat annual scan in 12 months.  Lung RADS 3: nodules that are probably non-concerning , includes nodules with a low likelihood of becoming an active cancer.  Recommendation is for a 6-month repeat screening scan. Often noted after an upper respiratory illness. We will be in touch to make sure you have no questions, and to schedule your 6-month scan.  Lung RADS 4 A: nodules with concerning findings, recommendation is most often for a follow up scan in 3 months or additional testing based on our provider's assessment of the scan. We will be in touch to make sure you have no questions and to schedule the recommended 3 month follow up scan.  Lung RADS 4 B:  indicates findings that are concerning. We will be in touch with you to schedule additional diagnostic testing based on our provider's  assessment of the scan.   

## 2019-06-21 NOTE — Progress Notes (Signed)
Please call patient and let them  know their  low dose Ct was read as a Lung RADS 2: nodules that are benign in appearance and behavior with a very low likelihood of becoming a clinically active cancer due to size or lack of growth. Recommendation per radiology is for a repeat LDCT in 12 months. .Please let them  know we will order and schedule their  annual screening scan for 06/2020. Please let them  know there was notation of CAD on their  scan. . Please place order for annual  screening scan for  06/2020 and fax results to PCP. Thanks so much.

## 2019-06-25 ENCOUNTER — Other Ambulatory Visit: Payer: Self-pay | Admitting: *Deleted

## 2019-06-25 DIAGNOSIS — Z87891 Personal history of nicotine dependence: Secondary | ICD-10-CM

## 2019-07-10 NOTE — Progress Notes (Unsigned)
Pt had eye exam 06/01/2019 with some minor changes to vision and ocular health. Full report scanned.

## 2019-09-28 LAB — HM MAMMOGRAPHY: HM Mammogram: NORMAL (ref 0–4)

## 2020-09-10 ENCOUNTER — Other Ambulatory Visit: Payer: Self-pay

## 2020-09-11 ENCOUNTER — Encounter: Payer: Self-pay | Admitting: Internal Medicine

## 2020-09-11 ENCOUNTER — Ambulatory Visit: Payer: Managed Care, Other (non HMO) | Admitting: Internal Medicine

## 2020-09-11 VITALS — BP 118/76 | HR 64 | Temp 98.1°F | Ht 61.0 in | Wt 146.0 lb

## 2020-09-11 DIAGNOSIS — E559 Vitamin D deficiency, unspecified: Secondary | ICD-10-CM | POA: Diagnosis not present

## 2020-09-11 DIAGNOSIS — J431 Panlobular emphysema: Secondary | ICD-10-CM | POA: Diagnosis not present

## 2020-09-11 DIAGNOSIS — E7800 Pure hypercholesterolemia, unspecified: Secondary | ICD-10-CM | POA: Diagnosis not present

## 2020-09-11 DIAGNOSIS — Z23 Encounter for immunization: Secondary | ICD-10-CM | POA: Diagnosis not present

## 2020-09-11 DIAGNOSIS — Z0000 Encounter for general adult medical examination without abnormal findings: Secondary | ICD-10-CM | POA: Diagnosis not present

## 2020-09-11 LAB — CBC WITH DIFFERENTIAL/PLATELET
Basophils Absolute: 0.1 10*3/uL (ref 0.0–0.1)
Basophils Relative: 1.1 % (ref 0.0–3.0)
Eosinophils Absolute: 0.1 10*3/uL (ref 0.0–0.7)
Eosinophils Relative: 1.1 % (ref 0.0–5.0)
HCT: 39 % (ref 36.0–46.0)
Hemoglobin: 13.1 g/dL (ref 12.0–15.0)
Lymphocytes Relative: 38 % (ref 12.0–46.0)
Lymphs Abs: 2.2 10*3/uL (ref 0.7–4.0)
MCHC: 33.7 g/dL (ref 30.0–36.0)
MCV: 86.5 fl (ref 78.0–100.0)
Monocytes Absolute: 0.5 10*3/uL (ref 0.1–1.0)
Monocytes Relative: 8.6 % (ref 3.0–12.0)
Neutro Abs: 3 10*3/uL (ref 1.4–7.7)
Neutrophils Relative %: 51.2 % (ref 43.0–77.0)
Platelets: 252 10*3/uL (ref 150.0–400.0)
RBC: 4.51 Mil/uL (ref 3.87–5.11)
RDW: 13.4 % (ref 11.5–15.5)
WBC: 5.8 10*3/uL (ref 4.0–10.5)

## 2020-09-11 LAB — HEPATIC FUNCTION PANEL
ALT: 11 U/L (ref 0–35)
AST: 16 U/L (ref 0–37)
Albumin: 4.4 g/dL (ref 3.5–5.2)
Alkaline Phosphatase: 55 U/L (ref 39–117)
Bilirubin, Direct: 0.1 mg/dL (ref 0.0–0.3)
Total Bilirubin: 0.6 mg/dL (ref 0.2–1.2)
Total Protein: 6.9 g/dL (ref 6.0–8.3)

## 2020-09-11 LAB — LIPID PANEL
Cholesterol: 258 mg/dL — ABNORMAL HIGH (ref 0–200)
HDL: 76.8 mg/dL (ref >39.00)
LDL Cholesterol: 170 mg/dL — ABNORMAL HIGH (ref 0–99)
NonHDL: 181.37
Total CHOL/HDL Ratio: 3
Triglycerides: 56 mg/dL (ref 0.0–149.0)
VLDL: 11.2 mg/dL (ref 0.0–40.0)

## 2020-09-11 LAB — BASIC METABOLIC PANEL
BUN: 15 mg/dL (ref 6–23)
CO2: 26 mEq/L (ref 19–32)
Calcium: 9.2 mg/dL (ref 8.4–10.5)
Chloride: 102 mEq/L (ref 96–112)
Creatinine, Ser: 0.57 mg/dL (ref 0.40–1.20)
GFR: 100.51 mL/min (ref >60.00)
Glucose, Bld: 90 mg/dL (ref 70–99)
Potassium: 3.9 mEq/L (ref 3.5–5.1)
Sodium: 135 mEq/L (ref 135–145)

## 2020-09-11 LAB — VITAMIN D 25 HYDROXY (VIT D DEFICIENCY, FRACTURES): VITD: 34.03 ng/mL (ref 30.00–100.00)

## 2020-09-11 LAB — TSH: TSH: 1.61 u[IU]/mL (ref 0.35–4.50)

## 2020-09-11 NOTE — Progress Notes (Signed)
Subjective:  Patient ID: Briana Meadows, female    DOB: 30-Nov-1960  Age: 60 y.o. MRN: [id number]  CC: Annual Exam  This visit occurred during the SARS-CoV-2 public health emergency.  Safety protocols were in place, including screening questions prior to the visit, additional usage of staff PPE, and extensive cleaning of exam room while observing appropriate contact time as indicated for disinfecting solutions.    HPI Briana Meadows presents for a CPX, f/up, and to establish.  She has felt well recently and offers no complaints.  She has a history of hyperlipidemia but is not taking a statin.  She is active and denies any recent episodes of chest pain, shortness of breath, cough, palpitations, edema, or fatigue.  She quit smoking 8 years ago.  She undrewent a CT scan of the chest done about a year ago that showed emphysema.  History Briana Meadows has a past medical history of Blood transfusion complicating pregnancy (09/8848), Heart murmur, SVD (spontaneous vaginal delivery), and Vitamin D deficiency.   She has a past surgical history that includes Dilation and curettage of uterus; Wisdom tooth extraction; Colonoscopy; Robotic assisted total hysterectomy (N/A, 03/14/2014); and Abdominal hysterectomy.   Briana Meadows family history includes COPD in Briana Meadows mother; Cancer in Briana Meadows maternal grandmother and mother; Diabetes in Briana Meadows brother; Hyperlipidemia in Briana Meadows mother; Non-Hodgkin's lymphoma in Briana Meadows sister.She reports that she quit smoking about 8 years ago. Briana Meadows smoking use included cigarettes. She has a 35.00 pack-year smoking history. She has quit using smokeless tobacco. She reports that she does not drink alcohol and does not use drugs.  Outpatient Medications Prior to Visit  Medication Sig Dispense Refill  . bacitracin-polymyxin b (POLYSPORIN) ophthalmic ointment Place 1 application into the right eye 3 (three) times daily. 3.5 g 0  . cholecalciferol (VITAMIN D) 1000 UNITS tablet Take 2,000 Units by mouth  daily.     Marland Kitchen b complex vitamins tablet Take 1 tablet by mouth daily.    Marland Kitchen CALCIUM PO Take 1,000 mg by mouth.     No facility-administered medications prior to visit.    ROS Review of Systems  Constitutional: Negative.  Negative for appetite change, diaphoresis and fatigue.  HENT: Negative.   Eyes: Negative.   Respiratory: Negative for cough, chest tightness, shortness of breath and wheezing.   Cardiovascular: Negative for chest pain, palpitations and leg swelling.  Gastrointestinal: Negative for abdominal pain, constipation, diarrhea, nausea and vomiting.  Endocrine: Negative.   Genitourinary: Negative.   Musculoskeletal: Negative for arthralgias and myalgias.  Skin: Negative.   Neurological: Negative.  Negative for dizziness, weakness, light-headedness and headaches.  Hematological: Negative for adenopathy. Does not bruise/bleed easily.  Psychiatric/Behavioral: Negative.     Objective:  BP 118/76 (BP Location: Left Arm, Patient Position: Sitting, Cuff Size: Large)   Pulse 64   Temp 98.1 F (36.7 C) (Oral)   Ht 5\' 1"  (1.549 m)   Wt 146 lb (66.2 kg)   LMP 03/14/2014   SpO2 97%   BMI 27.59 kg/m   Physical Exam Vitals reviewed.  HENT:     Nose: Nose normal.     Mouth/Throat:     Mouth: Mucous membranes are moist.  Eyes:     General: No scleral icterus.    Conjunctiva/sclera: Conjunctivae normal.  Cardiovascular:     Rate and Rhythm: Normal rate and regular rhythm.     Heart sounds: No murmur heard.   Pulmonary:     Effort: Pulmonary effort is normal.     Breath sounds: No stridor.  No wheezing, rhonchi or rales.  Abdominal:     General: Abdomen is flat. Bowel sounds are normal. There is no distension.     Palpations: Abdomen is soft.  Musculoskeletal:        General: Normal range of motion.     Cervical back: Neck supple.     Right lower leg: No edema.     Left lower leg: No edema.  Lymphadenopathy:     Cervical: No cervical adenopathy.  Skin:    General:  Skin is warm and dry.     Coloration: Skin is not pale.  Neurological:     General: No focal deficit present.     Mental Status: She is alert.  Psychiatric:        Mood and Affect: Mood normal.        Behavior: Behavior normal.     Lab Results  Component Value Date   WBC 5.8 09/11/2020   HGB 13.1 09/11/2020   HCT 39.0 09/11/2020   PLT 252.0 09/11/2020   GLUCOSE 90 09/11/2020   CHOL 258 (H) 09/11/2020   TRIG 56.0 09/11/2020   HDL 76.80 09/11/2020   LDLCALC 170 (H) 09/11/2020   ALT 11 09/11/2020   AST 16 09/11/2020   NA 135 09/11/2020   K 3.9 09/11/2020   CL 102 09/11/2020   CREATININE 0.57 09/11/2020   BUN 15 09/11/2020   CO2 26 09/11/2020   TSH 1.61 09/11/2020   HGBA1C 5.9 (H) 02/20/2019    Assessment & Plan:   Briana Meadows was seen today for annual exam.  Diagnoses and all orders for this visit:  Pure hypercholesterolemia- Briana Meadows ASCVD risk scire is less than 15% so I did not recommend a statin for CV risk reduction. -     CBC with Differential/Platelet; Future -     Basic metabolic panel; Future -     Hepatic function panel; Future -     TSH; Future -     TSH -     Hepatic function panel -     Basic metabolic panel -     CBC with Differential/Platelet  Routine general medical examination at a health care facility- Exam completed, labs reviewed, vaccines reviewed and updated, cancer screenings are up-to-date, patient education was given. 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  She agreed to receive a pneumonia vaccine. -     CBC with Differential/Platelet; Future -     CBC with Differential/Platelet  Vitamin D deficiency -     VITAMIN D 25 Hydroxy (Vit-D Deficiency, Fractures); Future -     VITAMIN D 25 Hydroxy (Vit-D Deficiency, Fractures)  Need for vaccination -     Pneumococcal conjugate vaccine 20-valent (Prevnar 20)   I have discontinued Briana Gula "Jenny"'s CALCIUM PO and b complex  vitamins. I am also having Briana Meadows maintain Briana Meadows cholecalciferol and bacitracin-polymyxin b.  No orders of the defined types were placed in this encounter.    Follow-up: Return in about 1 year (around 09/11/2021).  Scarlette Calico, MD

## 2020-09-11 NOTE — Patient Instructions (Signed)
Health Maintenance, Female Adopting a healthy lifestyle and getting preventive care are important in promoting health and wellness. Ask your health care provider about:  The right schedule for you to have regular tests and exams.  Things you can do on your own to prevent diseases and keep yourself healthy. What should I know about diet, weight, and exercise? Eat a healthy diet  Eat a diet that includes plenty of vegetables, fruits, low-fat dairy products, and lean protein.  Do not eat a lot of foods that are high in solid fats, added sugars, or sodium.   Maintain a healthy weight Body mass index (BMI) is used to identify weight problems. It estimates body fat based on height and weight. Your health care provider can help determine your BMI and help you achieve or maintain a healthy weight. Get regular exercise Get regular exercise. This is one of the most important things you can do for your health. Most adults should:  Exercise for at least 150 minutes each week. The exercise should increase your heart rate and make you sweat (moderate-intensity exercise).  Do strengthening exercises at least twice a week. This is in addition to the moderate-intensity exercise.  Spend less time sitting. Even light physical activity can be beneficial. Watch cholesterol and blood lipids Have your blood tested for lipids and cholesterol at 58 years of age, then have this test every 5 years. Have your cholesterol levels checked more often if:  Your lipid or cholesterol levels are high.  You are older than 58 years of age.  You are at high risk for heart disease. What should I know about cancer screening? Depending on your health history and family history, you may need to have cancer screening at various ages. This may include screening for:  Breast cancer.  Cervical cancer.  Colorectal cancer.  Skin cancer.  Lung cancer. What should I know about heart disease, diabetes, and high blood  pressure? Blood pressure and heart disease  High blood pressure causes heart disease and increases the risk of stroke. This is more likely to develop in people who have high blood pressure readings, are of African descent, or are overweight.  Have your blood pressure checked: ? Every 3-5 years if you are 18-39 years of age. ? Every year if you are 40 years old or older. Diabetes Have regular diabetes screenings. This checks your fasting blood sugar level. Have the screening done:  Once every three years after age 40 if you are at a normal weight and have a low risk for diabetes.  More often and at a younger age if you are overweight or have a high risk for diabetes. What should I know about preventing infection? Hepatitis B If you have a higher risk for hepatitis B, you should be screened for this virus. Talk with your health care provider to find out if you are at risk for hepatitis B infection. Hepatitis C Testing is recommended for:  Everyone born from 1945 through 1965.  Anyone with known risk factors for hepatitis C. Sexually transmitted infections (STIs)  Get screened for STIs, including gonorrhea and chlamydia, if: ? You are sexually active and are younger than 58 years of age. ? You are older than 58 years of age and your health care provider tells you that you are at risk for this type of infection. ? Your sexual activity has changed since you were last screened, and you are at increased risk for chlamydia or gonorrhea. Ask your health care provider   if you are at risk.  Ask your health care provider about whether you are at high risk for HIV. Your health care provider may recommend a prescription medicine to help prevent HIV infection. If you choose to take medicine to prevent HIV, you should first get tested for HIV. You should then be tested every 3 months for as long as you are taking the medicine. Pregnancy  If you are about to stop having your period (premenopausal) and  you may become pregnant, seek counseling before you get pregnant.  Take 400 to 800 micrograms (mcg) of folic acid every day if you become pregnant.  Ask for birth control (contraception) if you want to prevent pregnancy. Osteoporosis and menopause Osteoporosis is a disease in which the bones lose minerals and strength with aging. This can result in bone fractures. If you are 65 years old or older, or if you are at risk for osteoporosis and fractures, ask your health care provider if you should:  Be screened for bone loss.  Take a calcium or vitamin D supplement to lower your risk of fractures.  Be given hormone replacement therapy (HRT) to treat symptoms of menopause. Follow these instructions at home: Lifestyle  Do not use any products that contain nicotine or tobacco, such as cigarettes, e-cigarettes, and chewing tobacco. If you need help quitting, ask your health care provider.  Do not use street drugs.  Do not share needles.  Ask your health care provider for help if you need support or information about quitting drugs. Alcohol use  Do not drink alcohol if: ? Your health care provider tells you not to drink. ? You are pregnant, may be pregnant, or are planning to become pregnant.  If you drink alcohol: ? Limit how much you use to 0-1 drink a day. ? Limit intake if you are breastfeeding.  Be aware of how much alcohol is in your drink. In the [place] one drink equals one 12 oz bottle of beer (355 mL), one 5 oz glass of wine (148 mL), or one 1 oz glass of hard liquor (44 mL). General instructions  Schedule regular health, dental, and eye exams.  Stay current with your vaccines.  Tell your health care provider if: ? You often feel depressed. ? You have ever been abused or do not feel safe at home. Summary  Adopting a healthy lifestyle and getting preventive care are important in promoting health and wellness.  Follow your health care provider's instructions about healthy  diet, exercising, and getting tested or screened for diseases.  Follow your health care provider's instructions on monitoring your cholesterol and blood pressure. This information is not intended to replace advice given to you by your health care provider. Make sure you discuss any questions you have with your health care provider. Document Revised: 04/19/2018 Document Reviewed: 04/19/2018 Elsevier Patient Education  2021 [employer]  

## 2020-09-18 IMAGING — CT CT CHEST LUNG CANCER SCREENING LOW DOSE W/O CM
1 series · 10 of 10 positions shown, 13 images · non-contrast
Comparison: 02/28/2018

CLINICAL DATA: 58-year-old female with 35 pack year history of
smoking. Lung cancer screening.

EXAM:
CT CHEST WITHOUT CONTRAST LOW-DOSE FOR LUNG CANCER SCREENING
TECHNIQUE: Multidetector CT imaging of the chest was performed following the
standard protocol without IV contrast.

[ct lung segmentation data · axial · 0.69mm/px · z∈[-277,-277]mm · 10 of 283 frames shown]
[frame 1/283  mediastinal]
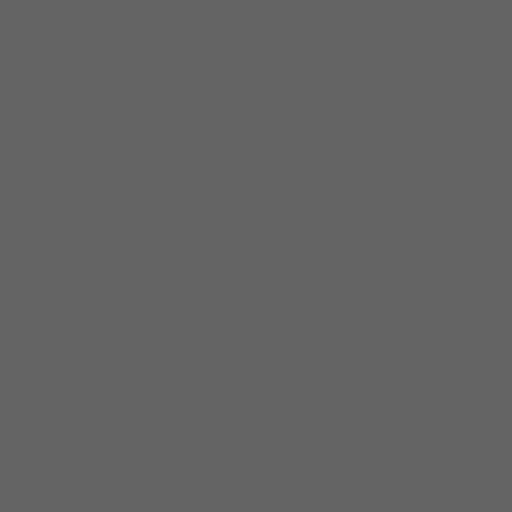
[frame 1/283  lung]
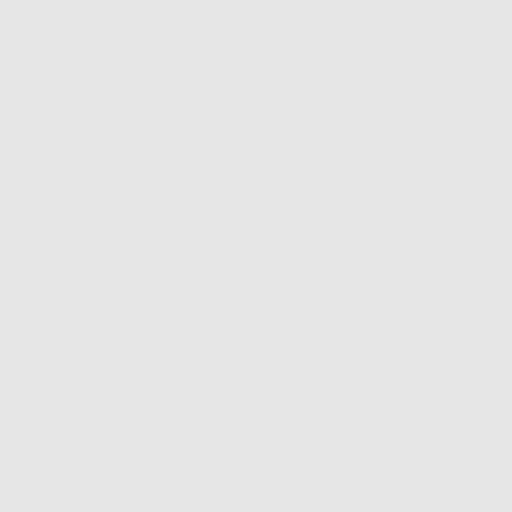
[frame 32/283  lung]
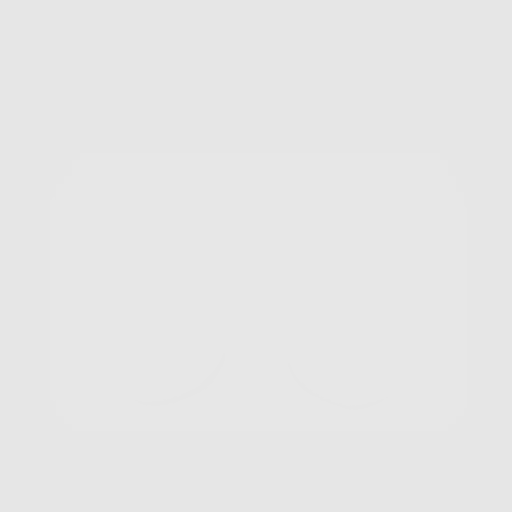
[frame 63/283  lung]
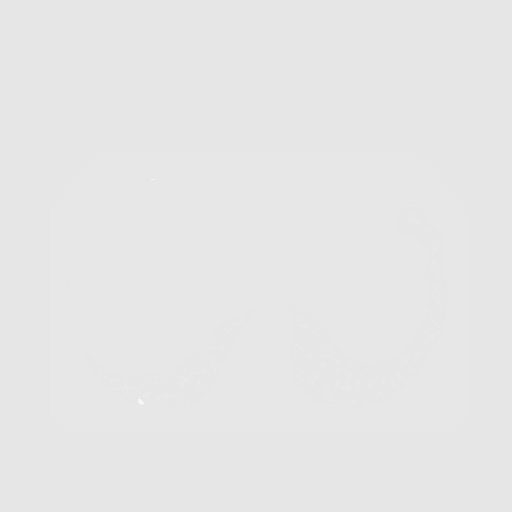
[frame 95/283  lung]
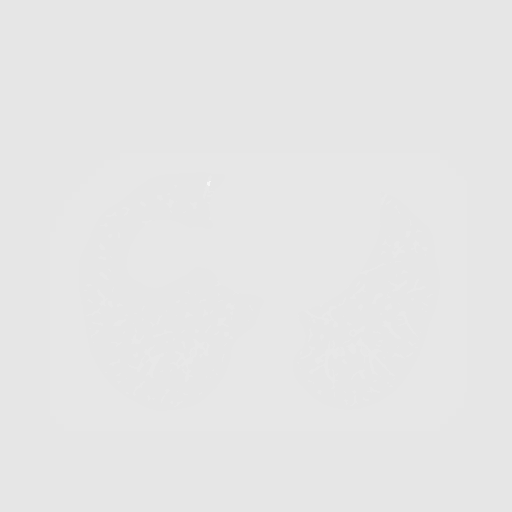
[frame 126/283  mediastinal]
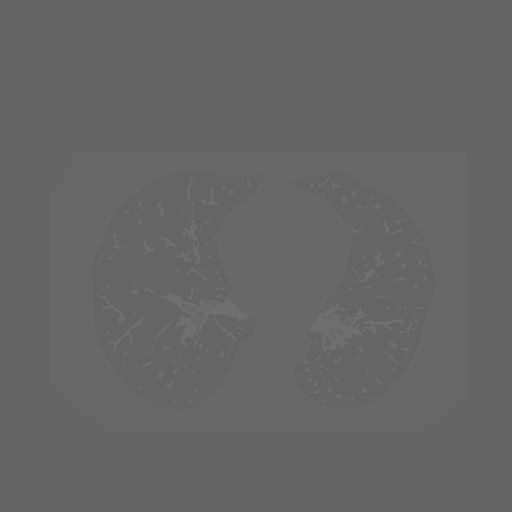
[frame 126/283  lung]
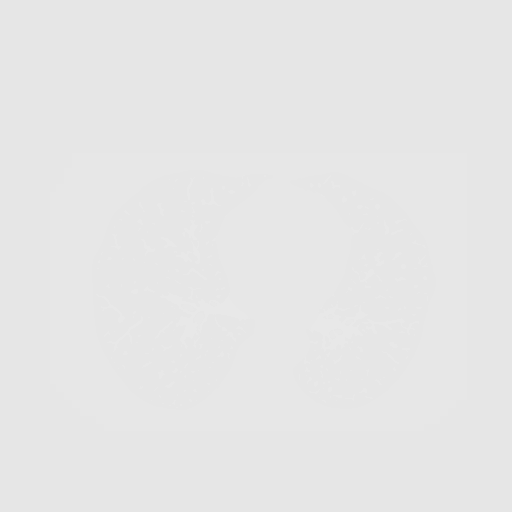
[frame 157/283  lung]
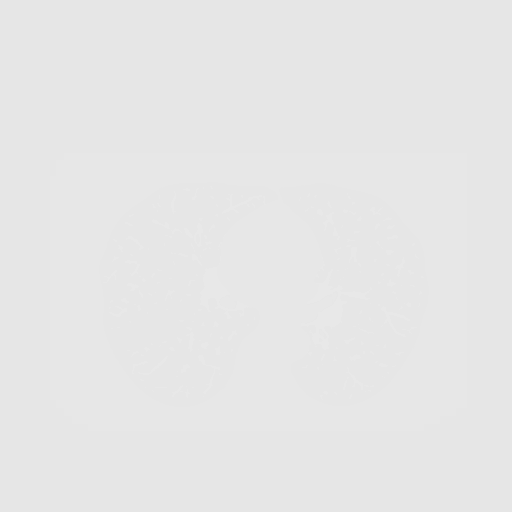
[frame 189/283  lung]
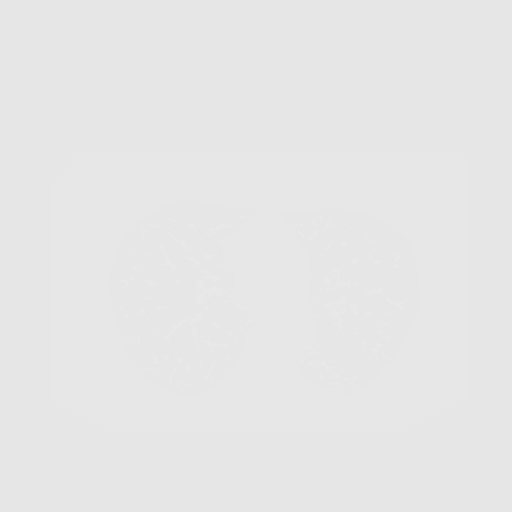
[frame 220/283  lung]
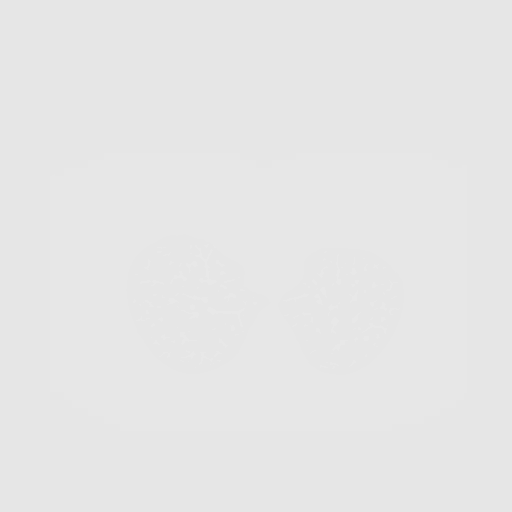
[frame 251/283  mediastinal]
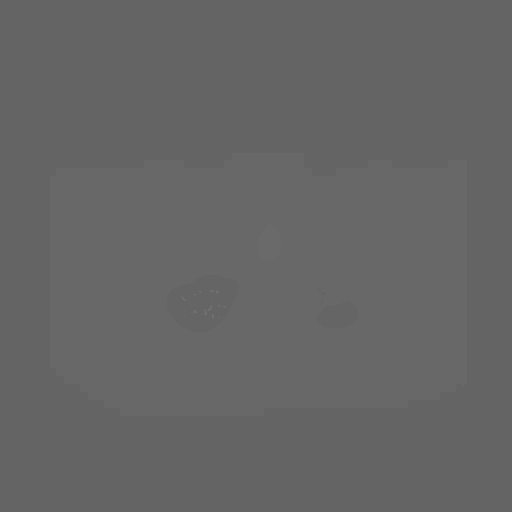
[frame 251/283  lung]
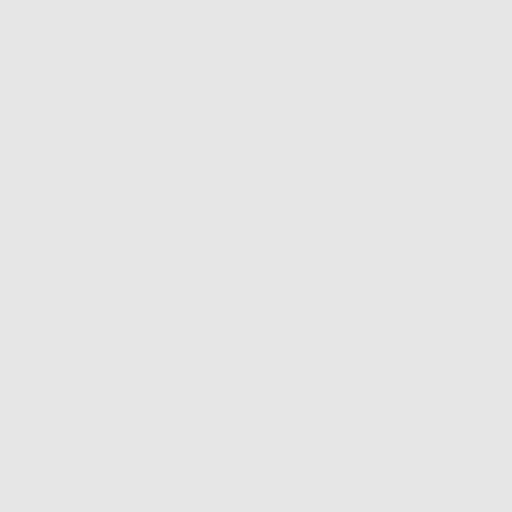
[frame 283/283  lung]
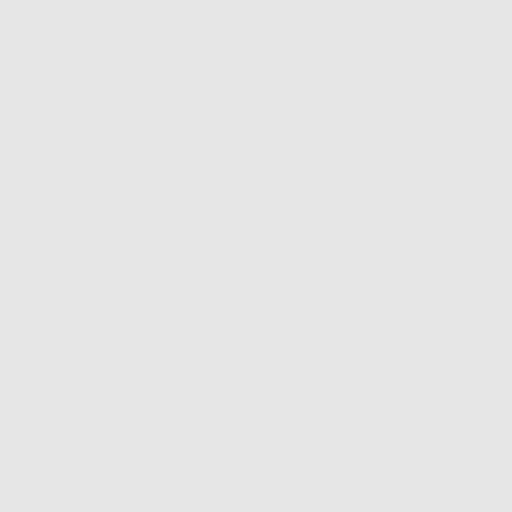

[10 of 10 positions shown; findings below may reference images not displayed]

FINDINGS: Cardiovascular: The heart size is normal. No substantial pericardial
effusion. No thoracic aortic aneurysm.

Mediastinum/Nodes: Calcified nodal tissue is seen in the right
hilum. No mediastinal lymphadenopathy. The esophagus has normal
imaging features. There is no axillary lymphadenopathy.

Lungs/Pleura: Centrilobular emphsyema noted. 2 mm right upper lobe
pulmonary nodule identified. The right lower lobe calcified
granuloma is stable. No focal airspace consolidation. No pleural
effusion.

Upper Abdomen: Unremarkable.

Musculoskeletal: No worrisome lytic or sclerotic osseous
abnormality.
IMPRESSION: Lung-RADS 2, benign appearance or behavior. Continue annual
screening with low-dose chest CT without contrast in 12 months.

Emphysema. ([id number])

## 2023-05-27 ENCOUNTER — Encounter: Payer: Self-pay | Admitting: Acute Care
